# Patient Record
Sex: Female | Born: 1985
Health system: Southern US, Community
[De-identification: ages and names within clinical notes are randomized; demographics above are authoritative.]

---
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
## 2014-05-20 LAB — CBC AND DIFFERENTIAL
HEMATOCRIT: 41 % (ref 36–46)
Hemoglobin: 15 g/dL (ref 12.0–16.0)
NEUTROS ABS: 55 /uL
Platelets: 248 10*3/uL (ref 150–399)
WBC: 4.6 10^3/mL

## 2014-05-20 LAB — TSH: TSH: 2.76 u[IU]/mL (ref <=5.90)

## 2014-05-20 LAB — HEPATIC FUNCTION PANEL
ALK PHOS: 75 U/L (ref 25–125)
ALT: 14 U/L (ref 7–35)
AST: 23 U/L (ref 13–35)
BILIRUBIN, TOTAL: 0.7 mg/dL

## 2014-05-20 LAB — BASIC METABOLIC PANEL
BUN: 12 mg/dL (ref 4–21)
CREATININE: 0.8 mg/dL (ref <=1.1)
Glucose: 92 mg/dL
POTASSIUM: 4.4 mmol/L (ref 3.4–5.3)
SODIUM: 139 mmol/L (ref 137–147)

## 2014-05-20 LAB — LIPID PANEL
CHOLESTEROL: 193 mg/dL (ref 0–200)
HDL: 86 mg/dL — AB (ref 35–70)
LDL CALC: 83 mg/dL
LDl/HDL Ratio: 1
Triglycerides: 119 mg/dL (ref 40–160)

## 2014-10-05 DIAGNOSIS — G4700 Insomnia, unspecified: Secondary | ICD-10-CM | POA: Insufficient documentation

## 2014-10-05 DIAGNOSIS — F988 Other specified behavioral and emotional disorders with onset usually occurring in childhood and adolescence: Secondary | ICD-10-CM | POA: Insufficient documentation

## 2014-10-05 DIAGNOSIS — F419 Anxiety disorder, unspecified: Secondary | ICD-10-CM | POA: Insufficient documentation

## 2014-10-05 DIAGNOSIS — G2581 Restless legs syndrome: Secondary | ICD-10-CM | POA: Insufficient documentation

## 2014-10-05 DIAGNOSIS — L709 Acne, unspecified: Secondary | ICD-10-CM | POA: Insufficient documentation

## 2014-11-17 ENCOUNTER — Other Ambulatory Visit: Payer: Self-pay | Admitting: Family Medicine

## 2014-11-17 NOTE — Telephone Encounter (Signed)
Pt contacted office for refill request on the following medications: Adderall ER 30 mg. Thanks TNP

## 2014-11-18 MED ORDER — AMPHETAMINE-DEXTROAMPHET ER 30 MG PO CP24: 30.0000 mg | ORAL_CAPSULE | Freq: Once | ORAL | Status: DC

## 2014-11-18 NOTE — Telephone Encounter (Signed)
Please review, order pulled down -aa

## 2014-12-02 ENCOUNTER — Encounter: Payer: Self-pay | Admitting: Family Medicine

## 2014-12-02 ENCOUNTER — Ambulatory Visit (INDEPENDENT_AMBULATORY_CARE_PROVIDER_SITE_OTHER): Payer: BLUE CROSS/BLUE SHIELD | Admitting: Family Medicine

## 2014-12-02 VITALS — BP 112/76 | HR 72 | Temp 98.6°F | Resp 12 | Ht 65.5 in | Wt 174.0 lb

## 2014-12-02 DIAGNOSIS — F909 Attention-deficit hyperactivity disorder, unspecified type: Secondary | ICD-10-CM | POA: Diagnosis not present

## 2014-12-02 DIAGNOSIS — G4700 Insomnia, unspecified: Secondary | ICD-10-CM

## 2014-12-02 DIAGNOSIS — F988 Other specified behavioral and emotional disorders with onset usually occurring in childhood and adolescence: Secondary | ICD-10-CM

## 2014-12-02 MED ORDER — AMPHETAMINE-DEXTROAMPHET ER 30 MG PO CP24: 30.0000 mg | ORAL_CAPSULE | Freq: Once | ORAL | Status: DC

## 2014-12-02 NOTE — Progress Notes (Signed)
Patient ID: Michelle Morales, female   DOB: 04/13/1986, 29 y.o.   MRN: [id number]    Subjective:  HPI  ADD Patient is here for follow up-LOV in February 2016. She takes Adderall XR 30 mg MOnday through Friday when she works and when she has to work on the weekends. Symptoms are controlled. Not having side effects.  Insomnia Here for follow up. Not sleeping well again for about 1 week now. Takes Lorazepam at bedtime but has not helped, she does have issues in her life that she thinks may be contributing to this.   Prior to Admission medications   Medication Sig Start Date End Date Taking? Authorizing Provider  amphetamine-dextroamphetamine (ADDERALL XR) 30 MG 24 hr capsule Take 1 capsule (30 mg total) by mouth once. 11/18/14   Iasha Mccalister Maceo Pro., MD  LORazepam (ATIVAN) 0.5 MG tablet Take by mouth. 06/25/14   Historical Provider, MD  MULTIPLE VITAMIN PO Take by mouth. 11/02/11   Historical Provider, MD    Patient Active Problem List   Diagnosis Date Noted  . Acne 10/05/2014  . ADD (attention deficit disorder) 10/05/2014  . Anxiety 10/05/2014  . Cannot sleep 10/05/2014  . Restless leg 10/05/2014    No past medical history on file.  History   Social History  . Marital Status: Single    Spouse Name: none  . Number of Children: 0  . Years of Education: 17   Occupational History  . [employer]    Social History Main Topics  . Smoking status: Former Smoker -- 0.25 packs/day    Types: Cigarettes    Quit date: 05/29/2010  . Smokeless tobacco: Never Used  . Alcohol Use: Yes     Comment: about 3 drinks a week  . Drug Use: No  . Sexual Activity: Not on file     Comment: ocassional smoker   Other Topics Concern  . Not on file   Social History Narrative    Allergies  Allergen Reactions  . Prednisone     Review of Systems  Constitutional: Negative for fever, chills, malaise/fatigue and diaphoresis.  Respiratory: Negative.   Cardiovascular: Negative.     Gastrointestinal: Negative.   Musculoskeletal: Negative.   Psychiatric/Behavioral: Depression: friends say she is depressed but she is not sure. The patient has insomnia.     Immunization History  Administered Date(s) Administered  . DTaP 02/18/1988, 04/19/1988, 06/19/1988, 06/15/1989, 12/26/1991  . Hepatitis A 10/05/2005, 07/20/2006  . Hepatitis B 03/14/1999, 04/25/1999, 09/26/1999  . HiB (PRP-OMP) 03/21/1989, 01/13/1993  . IPV 02/17/1988, 04/19/1988, 06/19/1988, 06/15/1989  . MMR 03/21/1989, 01/13/1993  . Meningococcal Conjugate 10/05/2005  . Td 10/23/2002  . Tdap 12/10/2008   Objective:  BP 112/76 mmHg  Pulse 72  Temp(Src) 98.6 F (37 C)  Resp 12  Ht 5' 5.5" (1.664 m)  Wt 174 lb (78.926 kg)  BMI 28.50 kg/m2  LMP 11/24/2014  Physical Exam  Lab Results  Component Value Date   WBC 4.6 05/20/2014   HGB 15.0 05/20/2014   HCT 41 05/20/2014   PLT 248 05/20/2014   CHOL 193 05/20/2014   TRIG 119 05/20/2014   HDL 86* 05/20/2014   LDLCALC 83 05/20/2014   TSH 2.76 05/20/2014    CMP     Component Value Date/Time   NA 139 05/20/2014   K 4.4 05/20/2014   BUN 12 05/20/2014   CREATININE 0.8 05/20/2014   AST 23 05/20/2014   ALT 14 05/20/2014   ALKPHOS 75 05/20/2014  Assessment and Plan :   1. ADD (attention deficit disorder) Stable. Refills provided x 3.  2. Insomnia Worsening.  3. Adjustment reaction Patient says her mother is an alcoholic and she is responsible for taking care of her day-to-day. She is going to try to do another city as she can do her job for many location. She feels confident this will help her. She is tearful and crying today but no actual depression rather diarrhea. Does feel trapped. Turn to clinic 2-3 months. I have done the exam and reviewed the above chart and it is accurate to the best of my knowledge.      Miguel Aschoff MD [facility] 12/02/2014 4:29 PM

## 2015-03-04 ENCOUNTER — Ambulatory Visit (INDEPENDENT_AMBULATORY_CARE_PROVIDER_SITE_OTHER): Payer: BLUE CROSS/BLUE SHIELD | Admitting: Family Medicine

## 2015-03-04 VITALS — BP 118/68 | HR 88 | Temp 98.0°F | Resp 16 | Wt 172.0 lb

## 2015-03-04 DIAGNOSIS — G4700 Insomnia, unspecified: Secondary | ICD-10-CM | POA: Diagnosis not present

## 2015-03-04 DIAGNOSIS — F419 Anxiety disorder, unspecified: Secondary | ICD-10-CM

## 2015-03-04 DIAGNOSIS — F4329 Adjustment disorder with other symptoms: Secondary | ICD-10-CM | POA: Diagnosis not present

## 2015-03-04 DIAGNOSIS — F909 Attention-deficit hyperactivity disorder, unspecified type: Secondary | ICD-10-CM | POA: Diagnosis not present

## 2015-03-04 DIAGNOSIS — F988 Other specified behavioral and emotional disorders with onset usually occurring in childhood and adolescence: Secondary | ICD-10-CM

## 2015-03-04 MED ORDER — AMPHETAMINE-DEXTROAMPHET ER 30 MG PO CP24: 30.0000 mg | ORAL_CAPSULE | Freq: Once | ORAL | Status: DC

## 2015-03-04 MED ORDER — LORAZEPAM 0.5 MG PO TABS: 0.5000 mg | ORAL_TABLET | Freq: Every day | ORAL | Status: DC

## 2015-03-04 NOTE — Progress Notes (Signed)
Patient ID: Michelle Morales, female   DOB: February 05, 1986, 29 y.o.   MRN: [id number]    Subjective:  HPI Pt is here for a 3 month follow up on ADD, insomnia and adjustment disorder. She reports that the Adderall is working well with no side effects. She is taking the Lorazepam about 3 nights a week. She reports that it is better some days, and worse some days. Adjustment disorder she states " it has its days..." She would like to be referred to a therapist.   Prior to Admission medications   Medication Sig Start Date End Date Taking? Authorizing Provider  amphetamine-dextroamphetamine (ADDERALL XR) 30 MG 24 hr capsule Take 1 capsule (30 mg total) by mouth once. 12/02/14  Yes Richard Maceo Pro., MD  LORazepam (ATIVAN) 0.5 MG tablet Take by mouth. 06/25/14  Yes Historical Provider, MD  MULTIPLE VITAMIN PO Take by mouth. 11/02/11  Yes Historical Provider, MD  amphetamine-dextroamphetamine (ADDERALL XR) 30 MG 24 hr capsule Take 1 capsule (30 mg total) by mouth once. 12/02/14   Richard Maceo Pro., MD  amphetamine-dextroamphetamine (ADDERALL XR) 30 MG 24 hr capsule Take 1 capsule (30 mg total) by mouth once. 12/02/14   Jerrol Banana., MD    Patient Active Problem List   Diagnosis Date Noted  . Acne 10/05/2014  . ADD (attention deficit disorder) 10/05/2014  . Anxiety 10/05/2014  . Cannot sleep 10/05/2014  . Restless leg 10/05/2014    No past medical history on file.  Social History   Social History  . Marital Status: Single    Spouse Name: none  . Number of Children: 0  . Years of Education: 17   Occupational History  . [employer]    Social History Main Topics  . Smoking status: Former Smoker -- 0.25 packs/day    Types: Cigarettes    Quit date: 05/29/2010  . Smokeless tobacco: Never Used  . Alcohol Use: Yes     Comment: about 3 drinks a week  . Drug Use: No  . Sexual Activity: Not on file     Comment: ocassional smoker   Other Topics Concern  . Not on file   Social  History Narrative    Allergies  Allergen Reactions  . Prednisone     Review of Systems  Constitutional: Negative.   HENT: Negative.   Eyes: Negative.   Respiratory: Negative.   Cardiovascular: Negative.   Gastrointestinal: Negative.   Genitourinary: Negative.   Musculoskeletal: Negative.   Skin: Negative.   Neurological: Negative.   Endo/Heme/Allergies: Negative.   Psychiatric/Behavioral: The patient is nervous/anxious and has insomnia.     Immunization History  Administered Date(s) Administered  . DTaP 02/18/1988, 04/19/1988, 06/19/1988, 06/15/1989, 12/26/1991  . Hepatitis A 10/05/2005, 07/20/2006  . Hepatitis B 03/14/1999, 04/25/1999, 09/26/1999  . HiB (PRP-OMP) 03/21/1989, 01/13/1993  . IPV 02/17/1988, 04/19/1988, 06/19/1988, 06/15/1989  . MMR 03/21/1989, 01/13/1993  . Meningococcal Conjugate 10/05/2005  . Td 10/23/2002  . Tdap 12/10/2008   Objective:  BP 118/68 mmHg  Pulse 88  Temp(Src) 98 F (36.7 C) (Oral)  Resp 16  Wt 172 lb (78.019 kg)  Physical Exam  Constitutional: She is oriented to person, place, and time and well-developed, well-nourished, and in no distress.  HENT:  Head: Normocephalic and atraumatic.  Right Ear: External ear normal.  Left Ear: External ear normal.  Nose: Nose normal.  Eyes: Conjunctivae are normal.  Neck: Neck supple.  Cardiovascular: Normal rate, regular rhythm and normal heart sounds.  Pulmonary/Chest: Effort normal and breath sounds normal.  Abdominal: Soft.  Neurological: She is alert and oriented to person, place, and time. Gait normal.  Skin: Skin is warm and dry.  Psychiatric: Mood, memory, affect and judgment normal.    Lab Results  Component Value Date   WBC 4.6 05/20/2014   HGB 15.0 05/20/2014   HCT 41 05/20/2014   PLT 248 05/20/2014   CHOL 193 05/20/2014   TRIG 119 05/20/2014   HDL 86* 05/20/2014   LDLCALC 83 05/20/2014   TSH 2.76 05/20/2014    CMP     Component Value Date/Time   NA 139 05/20/2014     K 4.4 05/20/2014   BUN 12 05/20/2014   CREATININE 0.8 05/20/2014   AST 23 05/20/2014   ALT 14 05/20/2014   ALKPHOS 75 05/20/2014    Assessment and Plan :  1. ADD (attention deficit disorder)  - amphetamine-dextroamphetamine (ADDERALL XR) 30 MG 24 hr capsule; Take 1 capsule (30 mg total) by mouth once.  Dispense: 30 capsule; Refill: 0 - amphetamine-dextroamphetamine (ADDERALL XR) 30 MG 24 hr capsule; Take 1 capsule (30 mg total) by mouth once.  Dispense: 30 capsule; Refill: 0  2. Anxiety  - LORazepam (ATIVAN) 0.5 MG tablet; Take 1 tablet (0.5 mg total) by mouth at bedtime.  Dispense: 30 tablet; Refill: 5 - Ambulatory referral to Psychology  3. Cannot sleep  - LORazepam (ATIVAN) 0.5 MG tablet; Take 1 tablet (0.5 mg total) by mouth at bedtime.  Dispense: 30 tablet; Refill: 5  4. Adjustment disorder with other symptom Pt trying to find another job in a different city. - Ambulatory referral to Psychology  I have done the exam and reviewed the above chart and it is accurate to the best of my knowledge.  Miguel Aschoff MD [facility] 03/04/2015 8:20 AM

## 2015-03-05 ENCOUNTER — Encounter: Payer: Self-pay | Admitting: Family Medicine

## 2015-06-09 ENCOUNTER — Encounter: Payer: Self-pay | Admitting: Family Medicine

## 2015-06-09 ENCOUNTER — Ambulatory Visit (INDEPENDENT_AMBULATORY_CARE_PROVIDER_SITE_OTHER): Payer: BLUE CROSS/BLUE SHIELD | Admitting: Family Medicine

## 2015-06-09 VITALS — BP 122/84 | HR 104 | Temp 99.0°F | Resp 16 | Wt 166.0 lb

## 2015-06-09 DIAGNOSIS — F988 Other specified behavioral and emotional disorders with onset usually occurring in childhood and adolescence: Secondary | ICD-10-CM

## 2015-06-09 DIAGNOSIS — F909 Attention-deficit hyperactivity disorder, unspecified type: Secondary | ICD-10-CM | POA: Diagnosis not present

## 2015-06-09 DIAGNOSIS — F419 Anxiety disorder, unspecified: Secondary | ICD-10-CM

## 2015-06-09 MED ORDER — AMPHETAMINE-DEXTROAMPHET ER 30 MG PO CP24: 30.0000 mg | ORAL_CAPSULE | Freq: Once | ORAL | Status: DC

## 2015-06-09 NOTE — Progress Notes (Signed)
Patient ID: Michelle Morales, female   DOB: 23-Nov-1985, 30 y.o.   MRN: [id number]    Subjective:  HPI Pt is here for a 3 month follow up of ADD. She reports that she is doing well on her Adderall, side known side effects. She is seeing a therapist for her anxiety and seems to be helping, she has only seen her a few times.   Prior to Admission medications   Medication Sig Start Date End Date Taking? Authorizing Provider  amphetamine-dextroamphetamine (ADDERALL XR) 30 MG 24 hr capsule Take 1 capsule (30 mg total) by mouth once. 06/09/15  Yes Carlin Attridge Maceo Pro., MD  LORazepam (ATIVAN) 0.5 MG tablet Take 1 tablet (0.5 mg total) by mouth at bedtime. 03/04/15  Yes Diannah Rindfleisch Maceo Pro., MD  MULTIPLE VITAMIN PO Take by mouth. 11/02/11  Yes Historical Provider, MD    Patient Active Problem List   Diagnosis Date Noted  . Acne 10/05/2014  . ADD (attention deficit disorder) 10/05/2014  . Anxiety 10/05/2014  . Cannot sleep 10/05/2014  . Restless leg 10/05/2014    History reviewed. No pertinent past medical history.  Social History   Social History  . Marital Status: Single    Spouse Name: none  . Number of Children: 0  . Years of Education: 17   Occupational History  . [employer]    Social History Main Topics  . Smoking status: Former Smoker -- 0.25 packs/day    Types: Cigarettes    Quit date: 05/29/2010  . Smokeless tobacco: Never Used  . Alcohol Use: Yes     Comment: about 3 drinks a week  . Drug Use: No  . Sexual Activity: Not on file     Comment: ocassional smoker   Other Topics Concern  . Not on file   Social History Narrative    Allergies  Allergen Reactions  . Prednisone     Review of Systems  Constitutional: Negative.   HENT: Negative.   Eyes: Negative.   Respiratory: Negative.   Cardiovascular: Negative.   Gastrointestinal: Negative.   Genitourinary: Negative.   Musculoskeletal: Negative.   Skin: Negative.   Neurological: Negative.     Endo/Heme/Allergies: Negative.   Psychiatric/Behavioral: Negative.     Immunization History  Administered Date(s) Administered  . DTaP 02/18/1988, 04/19/1988, 06/19/1988, 06/15/1989, 12/26/1991  . Hepatitis A 10/05/2005, 07/20/2006  . Hepatitis B 03/14/1999, 04/25/1999, 09/26/1999  . HiB (PRP-OMP) 03/21/1989, 01/13/1993  . IPV 02/17/1988, 04/19/1988, 06/19/1988, 06/15/1989  . MMR 03/21/1989, 01/13/1993  . Meningococcal Conjugate 10/05/2005  . Td 10/23/2002  . Tdap 12/10/2008   Objective:  BP 122/84 mmHg  Pulse 104  Temp(Src) 99 F (37.2 C) (Oral)  Resp 16  Wt 166 lb (75.297 kg)  Physical Exam  Constitutional: She is oriented to person, place, and time and well-developed, well-nourished, and in no distress.  HENT:  Head: Normocephalic and atraumatic.  Right Ear: External ear normal.  Left Ear: External ear normal.  Nose: Nose normal.  Eyes: Conjunctivae and EOM are normal. Pupils are equal, round, and reactive to light.  Neck: Normal range of motion. Neck supple.  Cardiovascular: Normal rate, regular rhythm, normal heart sounds and intact distal pulses.   Pulmonary/Chest: Effort normal and breath sounds normal.  Abdominal: Soft. Bowel sounds are normal.  Musculoskeletal: Normal range of motion.  Neurological: She is alert and oriented to person, place, and time. She has normal reflexes. Gait normal. GCS score is 15.  Skin: Skin is warm and dry.  Psychiatric: Mood, memory, affect and judgment normal.    Lab Results  Component Value Date   WBC 4.6 05/20/2014   HGB 15.0 05/20/2014   HCT 41 05/20/2014   PLT 248 05/20/2014   CHOL 193 05/20/2014   TRIG 119 05/20/2014   HDL 86* 05/20/2014   LDLCALC 83 05/20/2014   TSH 2.76 05/20/2014    CMP     Component Value Date/Time   NA 139 05/20/2014   K 4.4 05/20/2014   BUN 12 05/20/2014   CREATININE 0.8 05/20/2014   AST 23 05/20/2014   ALT 14 05/20/2014   ALKPHOS 75 05/20/2014    Assessment and Plan :  1. ADD  (attention deficit disorder) Refills provided times 3. - amphetamine-dextroamphetamine (ADDERALL XR) 30 MG 24 hr capsule; Take 1 capsule (30 mg total) by mouth once.  Dispense: 30 capsule; Refill: 0  2. Anxiety Improving with therapist. Continue to follow.  I have done the exam and reviewed the above chart and it is accurate to the best of my knowledge.  Patient was seen and examined by Dr. Miguel Aschoff, and noted scribed by Webb Laws, [place] MD [facility] 06/09/2015 8:24 AM

## 2015-09-21 ENCOUNTER — Other Ambulatory Visit: Payer: Self-pay | Admitting: Family Medicine

## 2015-09-21 DIAGNOSIS — F988 Other specified behavioral and emotional disorders with onset usually occurring in childhood and adolescence: Secondary | ICD-10-CM

## 2015-09-21 NOTE — Telephone Encounter (Signed)
Please review-aa 

## 2015-09-21 NOTE — Telephone Encounter (Signed)
Okay for 1 month 

## 2015-09-21 NOTE — Telephone Encounter (Signed)
Pt contacted office for refill request on the following medications: amphetamine-dextroamphetamine (ADDERALL XR) 30 MG 24 hr capsule. Please advise. Thanks TNP

## 2015-09-22 MED ORDER — AMPHETAMINE-DEXTROAMPHET ER 30 MG PO CP24: 30.0000 mg | ORAL_CAPSULE | Freq: Once | ORAL | Status: DC

## 2015-09-22 NOTE — Telephone Encounter (Signed)
RX printed and pt advised-aa 

## 2015-10-07 ENCOUNTER — Encounter: Payer: Self-pay | Admitting: Family Medicine

## 2015-10-07 ENCOUNTER — Ambulatory Visit (INDEPENDENT_AMBULATORY_CARE_PROVIDER_SITE_OTHER): Payer: BLUE CROSS/BLUE SHIELD | Admitting: Family Medicine

## 2015-10-07 VITALS — BP 128/76 | HR 86 | Resp 14 | Wt 172.0 lb

## 2015-10-07 DIAGNOSIS — F988 Other specified behavioral and emotional disorders with onset usually occurring in childhood and adolescence: Secondary | ICD-10-CM

## 2015-10-07 DIAGNOSIS — G4700 Insomnia, unspecified: Secondary | ICD-10-CM

## 2015-10-07 DIAGNOSIS — F909 Attention-deficit hyperactivity disorder, unspecified type: Secondary | ICD-10-CM | POA: Diagnosis not present

## 2015-10-07 DIAGNOSIS — F419 Anxiety disorder, unspecified: Secondary | ICD-10-CM

## 2015-10-07 MED ORDER — AMPHETAMINE-DEXTROAMPHET ER 30 MG PO CP24: 30.0000 mg | ORAL_CAPSULE | Freq: Once | ORAL | Status: DC

## 2015-10-07 NOTE — Progress Notes (Signed)
Patient ID: Michelle Morales, female   DOB: 1986-05-25, 30 y.o.   MRN: [id number]    Subjective:  HPI  Patient is here for follow up:  ADD: Patient takes Adderall XR 30 mg 1 tablet daily about 5 days a week for work. Symptoms are stable. She is seen therapist and has started taking Sam-E takes 200 mg 2 tablets daily for the past 2 weeks.  Insomnia: Patient takes Lorazepam about 3 to 5 days a week to help her sleep.   Prior to Admission medications   Medication Sig Start Date End Date Taking? Authorizing Provider  amphetamine-dextroamphetamine (ADDERALL XR) 30 MG 24 hr capsule Take 1 capsule (30 mg total) by mouth once. 09/22/15  Yes Richard Maceo Pro., MD  LORazepam (ATIVAN) 0.5 MG tablet Take 1 tablet (0.5 mg total) by mouth at bedtime. 03/04/15  Yes Richard Maceo Pro., MD  MULTIPLE VITAMIN PO Take by mouth. 11/02/11  Yes Historical Provider, MD  S-Adenosylmethionine (SAM-E) 200 MG TABS Take 2 tablets by mouth daily.   Yes Historical Provider, MD    Patient Active Problem List   Diagnosis Date Noted  . Acne 10/05/2014  . ADD (attention deficit disorder) 10/05/2014  . Anxiety 10/05/2014  . Cannot sleep 10/05/2014  . Restless leg 10/05/2014    No past medical history on file.  Social History   Social History  . Marital Status: Single    Spouse Name: none  . Number of Children: 0  . Years of Education: 17   Occupational History  . [employer]    Social History Main Topics  . Smoking status: Former Smoker -- 0.25 packs/day    Types: Cigarettes    Quit date: 05/29/2010  . Smokeless tobacco: Never Used  . Alcohol Use: Yes     Comment: about 3 drinks a week  . Drug Use: No  . Sexual Activity: Not on file     Comment: ocassional smoker   Other Topics Concern  . Not on file   Social History Narrative    Allergies  Allergen Reactions  . Prednisone     Review of Systems  Constitutional: Negative.   HENT: Negative.   Eyes: Negative.   Respiratory:  Negative.   Cardiovascular: Negative.   Musculoskeletal: Negative.   Skin: Negative.   Neurological: Negative.   Psychiatric/Behavioral: Positive for depression. The patient is nervous/anxious and has insomnia.        Symptoms controlled on the medication. Not suicidal or homicidal.    Immunization History  Administered Date(s) Administered  . DTaP 02/18/1988, 04/19/1988, 06/19/1988, 06/15/1989, 12/26/1991  . Hepatitis A 10/05/2005, 07/20/2006  . Hepatitis B 03/14/1999, 04/25/1999, 09/26/1999  . HiB (PRP-OMP) 03/21/1989, 01/13/1993  . IPV 02/17/1988, 04/19/1988, 06/19/1988, 06/15/1989  . MMR 03/21/1989, 01/13/1993  . Meningococcal Conjugate 10/05/2005  . Td 10/23/2002  . Tdap 12/10/2008   Objective:  BP 128/76 mmHg  Pulse 86  Resp 14  Wt 172 lb (78.019 kg)  LMP 09/23/2015  Physical Exam  Constitutional: She is oriented to person, place, and time and well-developed, well-nourished, and in no distress.  HENT:  Head: Normocephalic and atraumatic.  Right Ear: External ear normal.  Left Ear: External ear normal.  Nose: Nose normal.  Eyes: Conjunctivae are normal. Pupils are equal, round, and reactive to light.  Neck: Normal range of motion. Neck supple.  Cardiovascular: Normal rate, regular rhythm, normal heart sounds and intact distal pulses.   No murmur heard. Pulmonary/Chest: Effort normal and breath sounds  normal. No respiratory distress. She has no wheezes.  Abdominal: Soft.  Musculoskeletal: She exhibits no edema or tenderness.  Neurological: She is alert and oriented to person, place, and time.  Skin: Skin is warm and dry.  Psychiatric: Mood, memory, affect and judgment normal.    Lab Results  Component Value Date   WBC 4.6 05/20/2014   HGB 15.0 05/20/2014   HCT 41 05/20/2014   PLT 248 05/20/2014   CHOL 193 05/20/2014   TRIG 119 05/20/2014   HDL 86* 05/20/2014   LDLCALC 83 05/20/2014   TSH 2.76 05/20/2014    CMP     Component Value Date/Time   NA 139  05/20/2014   K 4.4 05/20/2014   BUN 12 05/20/2014   CREATININE 0.8 05/20/2014   AST 23 05/20/2014   ALT 14 05/20/2014   ALKPHOS 75 05/20/2014    Assessment and Plan :  1. ADD (attention deficit disorder) Stable. Refill given. RTC 4 months. - amphetamine-dextroamphetamine (ADDERALL XR) 30 MG 24 hr capsule; Take 1 capsule (30 mg total) by mouth once.  Dispense: 30 capsule; Refill: 0 - amphetamine-dextroamphetamine (ADDERALL XR) 30 MG 24 hr capsule; Take 1 capsule (30 mg total) by mouth once.  Dispense: 30 capsule; Refill: 0  2. Anxiety/Depression Patient seems to be better today from her last visit with family issues. Encouraged to continue seen therapist. Advised patient that we may need to add a medication to help her emotionally and that it is ok to do that. Will follow. Advised patient she can follow up with [place] sooner than 4 months if she needs adjustment with her medications.May need SSRI.  3. Cannot sleep/Insomnia Stable.  Patient was seen and examined by Dr. Eulas Post and note was scribed by Theressa Millard, RMA.    Miguel Aschoff MD [facility] 10/07/2015 8:07 AM

## 2015-11-01 ENCOUNTER — Ambulatory Visit (INDEPENDENT_AMBULATORY_CARE_PROVIDER_SITE_OTHER): Payer: BLUE CROSS/BLUE SHIELD | Admitting: Family Medicine

## 2015-11-01 VITALS — BP 104/82 | HR 80 | Temp 98.4°F | Resp 16 | Wt 172.0 lb

## 2015-11-01 DIAGNOSIS — G4700 Insomnia, unspecified: Secondary | ICD-10-CM | POA: Diagnosis not present

## 2015-11-01 DIAGNOSIS — F419 Anxiety disorder, unspecified: Secondary | ICD-10-CM

## 2015-11-01 DIAGNOSIS — F909 Attention-deficit hyperactivity disorder, unspecified type: Secondary | ICD-10-CM | POA: Diagnosis not present

## 2015-11-01 DIAGNOSIS — F988 Other specified behavioral and emotional disorders with onset usually occurring in childhood and adolescence: Secondary | ICD-10-CM

## 2015-11-01 MED ORDER — LORAZEPAM 1 MG PO TABS: 1.0000 mg | ORAL_TABLET | Freq: Every day | ORAL | Status: DC

## 2015-11-01 MED ORDER — SERTRALINE HCL 50 MG PO TABS: 50.0000 mg | ORAL_TABLET | Freq: Every day | ORAL | Status: DC

## 2015-11-01 NOTE — Progress Notes (Signed)
Patient ID: Michelle Morales, female   DOB: [birth year], 30 y.o.   MRN: [id number]    Subjective:  HPI  Patient is here to follow up on anxiety. Patient states that things with family and the issues she has had with that just up and down and just does not handle things good at this time. She does take Lorazepam as needed-last RX was given in October 2016, she takes it bedtime and it helps her calm down and be able to go to sleep.   Depression screen [facility] 2/9 11/01/2015 12/02/2014  Decreased Interest 3 0  Down, Depressed, Hopeless 2 2  PHQ - 2 Score 5 2  Altered sleeping 3 3  Tired, decreased energy 1 2  Change in appetite 0 0  Feeling bad or failure about yourself  0 0  Trouble concentrating 0 0  Moving slowly or fidgety/restless 0 0  Suicidal thoughts 0 0  PHQ-9 Score 9 7  Difficult doing work/chores Somewhat difficult Not difficult at all     Prior to Admission medications   Medication Sig Start Date End Date Taking? Authorizing Provider  amphetamine-dextroamphetamine (ADDERALL XR) 30 MG 24 hr capsule Take 1 capsule (30 mg total) by mouth once. 10/07/15  Yes Marvelous Bouwens Maceo Pro., MD  amphetamine-dextroamphetamine (ADDERALL XR) 30 MG 24 hr capsule Take 1 capsule (30 mg total) by mouth once. 10/07/15  Yes Jimya Ciani Maceo Pro., MD  LORazepam (ATIVAN) 0.5 MG tablet Take 1 tablet (0.5 mg total) by mouth at bedtime. 03/04/15  Yes Deserie Dirks Maceo Pro., MD  MULTIPLE VITAMIN PO Take by mouth. 11/02/11  Yes Historical Provider, MD  S-Adenosylmethionine (SAM-E) 200 MG TABS Take 2 tablets by mouth daily.   Yes Historical Provider, MD    Patient Active Problem List   Diagnosis Date Noted  . Acne 10/05/2014  . ADD (attention deficit disorder) 10/05/2014  . Anxiety 10/05/2014  . Cannot sleep 10/05/2014  . Restless leg 10/05/2014    No past medical history on file.  Social History   Social History  . Marital Status: Single    Spouse Name: none  . Number of Children: 0  . Years of  Education: 17   Occupational History  . [employer]    Social History Main Topics  . Smoking status: Former Smoker -- 0.25 packs/day    Types: Cigarettes    Quit date: 05/29/2010  . Smokeless tobacco: Never Used  . Alcohol Use: Yes     Comment: about 3 drinks a week  . Drug Use: No  . Sexual Activity: Not on file     Comment: ocassional smoker   Other Topics Concern  . Not on file   Social History Narrative    Allergies  Allergen Reactions  . Prednisone     Review of Systems  Constitutional: Negative.   Eyes: Negative.   Respiratory: Negative.   Cardiovascular: Negative.   Gastrointestinal: Negative.   Skin: Negative.   Neurological: Negative.   Endo/Heme/Allergies: Negative.   Psychiatric/Behavioral: Positive for depression. The patient is nervous/anxious and has insomnia.     Immunization History  Administered Date(s) Administered  . DTaP 02/18/1988, 04/19/1988, 06/19/1988, 06/15/1989, 12/26/1991  . Hepatitis A 10/05/2005, 07/20/2006  . Hepatitis B 03/14/1999, 04/25/1999, 09/26/1999  . HiB (PRP-OMP) 03/21/1989, 01/13/1993  . IPV 02/17/1988, 04/19/1988, 06/19/1988, 06/15/1989  . MMR 03/21/1989, 01/13/1993  . Meningococcal Conjugate 10/05/2005  . Td 10/23/2002  . Tdap 12/10/2008   Objective:  BP 104/82 mmHg  Pulse 80  Temp(Src) 98.4 F (36.9 C)  Resp 16  Wt 172 lb (78.019 kg)  LMP 09/23/2015  Physical Exam  Constitutional: She is oriented to person, place, and time and well-developed, well-nourished, and in no distress.  HENT:  Head: Normocephalic and atraumatic.  Right Ear: External ear normal.  Left Ear: External ear normal.  Nose: Nose normal.  Eyes: Conjunctivae are normal. Pupils are equal, round, and reactive to light.  Neck: Normal range of motion. Neck supple.  Cardiovascular: Normal rate, regular rhythm, normal heart sounds and intact distal pulses.   No murmur heard. Pulmonary/Chest: Effort normal and breath sounds normal. No  respiratory distress. She has no wheezes.  Abdominal: Soft.  Musculoskeletal: She exhibits no edema or tenderness.  Neurological: She is alert and oriented to person, place, and time. Gait normal.  Skin: Skin is warm and dry.  Psychiatric: Mood, memory, affect and judgment normal.    Lab Results  Component Value Date   WBC 4.6 05/20/2014   HGB 15.0 05/20/2014   HCT 41 05/20/2014   PLT 248 05/20/2014   CHOL 193 05/20/2014   TRIG 119 05/20/2014   HDL 86* 05/20/2014   LDLCALC 83 05/20/2014   TSH 2.76 05/20/2014    CMP     Component Value Date/Time   NA 139 05/20/2014   K 4.4 05/20/2014   BUN 12 05/20/2014   CREATININE 0.8 05/20/2014   AST 23 05/20/2014   ALT 14 05/20/2014   ALKPHOS 75 05/20/2014    Assessment and Plan :  1. Anxiety Worsening. Add Sertraline. Increase Lorazepam to 1 mg at bedtime. - sertraline (ZOLOFT) 50 MG tablet; Take 1 tablet (50 mg total) by mouth daily.  Dispense: 30 tablet; Refill: 12 - LORazepam (ATIVAN) 1 MG tablet; Take 1 tablet (1 mg total) by mouth at bedtime.  Dispense: 30 tablet; Refill: 5 Patient denies any suicidal thoughts or ideation. No alcohol use or illegal drugs. 2. Cannot sleep Increase dose. - LORazepam (ATIVAN) 1 MG tablet; Take 1 tablet (1 mg total) by mouth at bedtime.  Dispense: 30 tablet; Refill: 5  3. ADD (attention deficit disorder) Stable.  Patient was seen and examined by Dr. Eulas Post and note was scribed by Theressa Millard, RMA.     Miguel Aschoff MD [facility] 11/01/2015 3:38 PM

## 2015-12-06 ENCOUNTER — Ambulatory Visit (INDEPENDENT_AMBULATORY_CARE_PROVIDER_SITE_OTHER): Payer: BLUE CROSS/BLUE SHIELD | Admitting: Family Medicine

## 2015-12-06 ENCOUNTER — Encounter: Payer: Self-pay | Admitting: Family Medicine

## 2015-12-06 VITALS — BP 118/78 | HR 88 | Temp 97.8°F | Resp 16 | Wt 170.0 lb

## 2015-12-06 DIAGNOSIS — F909 Attention-deficit hyperactivity disorder, unspecified type: Secondary | ICD-10-CM

## 2015-12-06 DIAGNOSIS — F4329 Adjustment disorder with other symptoms: Secondary | ICD-10-CM

## 2015-12-06 DIAGNOSIS — F419 Anxiety disorder, unspecified: Secondary | ICD-10-CM | POA: Diagnosis not present

## 2015-12-06 DIAGNOSIS — F988 Other specified behavioral and emotional disorders with onset usually occurring in childhood and adolescence: Secondary | ICD-10-CM

## 2015-12-06 MED ORDER — BUPROPION HCL ER (XL) 150 MG PO TB24: 150.0000 mg | ORAL_TABLET | Freq: Every day | ORAL | Status: DC

## 2015-12-06 NOTE — Progress Notes (Signed)
Patient ID: Michelle Morales, female   DOB: January 02, 1986, 30 y.o.   MRN: [id number]    Subjective:  HPI Depression/anxiety- Pt is here today for a 4 week follow up of depression. She was started on Sertraline 50 mg at last OV. She reports that she could not take the medication because it gave her an upset stomach and when she was working in the yard or got hot she would get nauseous and have a headache, so she stopped the medication. She would like to try something else.  She is not suicidal. Prior to Admission medications   Medication Sig Start Date End Date Taking? Authorizing Provider  amphetamine-dextroamphetamine (ADDERALL XR) 30 MG 24 hr capsule Take 1 capsule (30 mg total) by mouth once. 10/07/15  Yes Richard Maceo Pro., MD  amphetamine-dextroamphetamine (ADDERALL XR) 30 MG 24 hr capsule Take 1 capsule (30 mg total) by mouth once. 10/07/15  Yes Richard Maceo Pro., MD  LORazepam (ATIVAN) 1 MG tablet Take 1 tablet (1 mg total) by mouth at bedtime. 11/01/15  Yes Richard Maceo Pro., MD  MULTIPLE VITAMIN PO Take by mouth. 11/02/11  Yes Historical Provider, MD    Patient Active Problem List   Diagnosis Date Noted  . Acne 10/05/2014  . ADD (attention deficit disorder) 10/05/2014  . Anxiety 10/05/2014  . Cannot sleep 10/05/2014  . Restless leg 10/05/2014    History reviewed. No pertinent past medical history.  Social History   Social History  . Marital Status: Single    Spouse Name: none  . Number of Children: 0  . Years of Education: 17   Occupational History  . [employer]    Social History Main Topics  . Smoking status: Former Smoker -- 0.25 packs/day    Types: Cigarettes    Quit date: 05/29/2010  . Smokeless tobacco: Never Used  . Alcohol Use: Yes     Comment: about 3 drinks a week  . Drug Use: No  . Sexual Activity: Not on file     Comment: ocassional smoker   Other Topics Concern  . Not on file   Social History Narrative    Allergies  Allergen  Reactions  . Prednisone     Review of Systems  Constitutional: Negative.   HENT: Negative.   Eyes: Negative.   Respiratory: Negative.   Cardiovascular: Negative.   Gastrointestinal: Negative.   Genitourinary: Negative.   Musculoskeletal: Negative.   Skin: Negative.   Neurological: Negative.   Endo/Heme/Allergies: Negative.   Psychiatric/Behavioral: Positive for depression. The patient is nervous/anxious.     Immunization History  Administered Date(s) Administered  . DTaP 02/18/1988, 04/19/1988, 06/19/1988, 06/15/1989, 12/26/1991  . Hepatitis A 10/05/2005, 07/20/2006  . Hepatitis B 03/14/1999, 04/25/1999, 09/26/1999  . HiB (PRP-OMP) 03/21/1989, 01/13/1993  . IPV 02/17/1988, 04/19/1988, 06/19/1988, 06/15/1989  . MMR 03/21/1989, 01/13/1993  . Meningococcal Conjugate 10/05/2005  . Td 10/23/2002  . Tdap 12/10/2008   Objective:  BP 118/78 mmHg  Pulse 88  Temp(Src) 97.8 F (36.6 C) (Oral)  Resp 16  Wt 170 lb (77.111 kg)  Physical Exam  Constitutional: She is oriented to person, place, and time and well-developed, well-nourished, and in no distress.  Cardiovascular: Normal rate, regular rhythm, normal heart sounds and intact distal pulses.   Pulmonary/Chest: Effort normal and breath sounds normal.  Neurological: She is alert and oriented to person, place, and time. She has normal reflexes. Gait normal. GCS score is 15.  Skin: Skin is warm and dry.  Psychiatric: Mood, memory, affect and judgment normal.    Lab Results  Component Value Date   WBC 4.6 05/20/2014   HGB 15.0 05/20/2014   HCT 41 05/20/2014   PLT 248 05/20/2014   CHOL 193 05/20/2014   TRIG 119 05/20/2014   HDL 86* 05/20/2014   LDLCALC 83 05/20/2014   TSH 2.76 05/20/2014    CMP     Component Value Date/Time   NA 139 05/20/2014   K 4.4 05/20/2014   BUN 12 05/20/2014   CREATININE 0.8 05/20/2014   AST 23 05/20/2014   ALT 14 05/20/2014   ALKPHOS 75 05/20/2014    Assessment and Plan :  1.  Anxiety  2. Adjustment disorder with other symptom Could not tolerate Sertraline. Will try Wellbutrin XL. If can not tolerate Wellbutrin will try Venlafaxine. Pt to continue counseling and follow up 1 month. Next choice Venlafaxine.  - buPROPion (WELLBUTRIN XL) 150 MG 24 hr tablet; Take 1 tablet (150 mg total) by mouth daily.  Dispense: 30 tablet; Refill: 12  3. ADD (attention deficit disorder) RF next month.   Patient was seen and examined by Dr. Miguel Aschoff, and noted scribed by Webb Laws, [place] MD [facility] 12/06/2015 8:19 AM

## 2016-01-04 ENCOUNTER — Ambulatory Visit (INDEPENDENT_AMBULATORY_CARE_PROVIDER_SITE_OTHER): Payer: BLUE CROSS/BLUE SHIELD | Admitting: Family Medicine

## 2016-01-04 ENCOUNTER — Encounter: Payer: Self-pay | Admitting: Family Medicine

## 2016-01-04 VITALS — BP 110/72 | HR 80 | Temp 98.4°F | Resp 16 | Wt 171.0 lb

## 2016-01-04 DIAGNOSIS — F909 Attention-deficit hyperactivity disorder, unspecified type: Secondary | ICD-10-CM | POA: Diagnosis not present

## 2016-01-04 DIAGNOSIS — F32A Depression, unspecified: Secondary | ICD-10-CM

## 2016-01-04 DIAGNOSIS — F419 Anxiety disorder, unspecified: Secondary | ICD-10-CM | POA: Diagnosis not present

## 2016-01-04 DIAGNOSIS — F988 Other specified behavioral and emotional disorders with onset usually occurring in childhood and adolescence: Secondary | ICD-10-CM

## 2016-01-04 DIAGNOSIS — F329 Major depressive disorder, single episode, unspecified: Secondary | ICD-10-CM

## 2016-01-04 MED ORDER — AMPHETAMINE-DEXTROAMPHET ER 30 MG PO CP24: 30.0000 mg | ORAL_CAPSULE | Freq: Once | ORAL | 0 refills | Status: DC

## 2016-01-04 MED ORDER — PAROXETINE HCL 20 MG PO TABS: 20.0000 mg | ORAL_TABLET | Freq: Every day | ORAL | 5 refills | Status: AC

## 2016-01-04 NOTE — Progress Notes (Signed)
Patient: Michelle SparrowCatherine V Silas Female    DOB: [birth year]   30 y.o.   MRN: [id number] Visit Date: 01/04/2016  Today's Provider: Megan Mansichard Amario Longmore Jr, MD   Chief Complaint  Patient presents with  . Depression  . Anxiety   Subjective:    HPI Patient comes in today to follow up on her depression and anxiety. Patient was started on Wellbutrin XL because she could not tolerate Sertraline. Patient reports that she also could not tolerate Wellbutrin. She reports that it made her feel like a "zombie". Patient is wanting to try something different.     Allergies  Allergen Reactions  . Prednisone    Current Meds  Medication Sig  . amphetamine-dextroamphetamine (ADDERALL XR) 30 MG 24 hr capsule Take 1 capsule (30 mg total) by mouth once.  Marland Kitchen. amphetamine-dextroamphetamine (ADDERALL XR) 30 MG 24 hr capsule Take 1 capsule (30 mg total) by mouth once.  Marland Kitchen. LORazepam (ATIVAN) 1 MG tablet Take 1 tablet (1 mg total) by mouth at bedtime.  . MULTIPLE VITAMIN PO Take by mouth.    Review of Systems  Constitutional: Negative.   Respiratory: Negative.   Cardiovascular: Negative.   Gastrointestinal: Negative.   Musculoskeletal: Negative.   Neurological: Positive for headaches. Negative for dizziness, tremors, seizures, syncope, facial asymmetry, speech difficulty, weakness, light-headedness and numbness.  Psychiatric/Behavioral: Positive for decreased concentration. Negative for agitation, behavioral problems, confusion, dysphoric mood, hallucinations, self-injury, sleep disturbance and suicidal ideas. The patient is nervous/anxious. The patient is not hyperactive.     Social History  Substance Use Topics  . Smoking status: Former Smoker    Packs/day: 0.25    Types: Cigarettes    Quit date: 05/29/2010  . Smokeless tobacco: Never Used  . Alcohol use Yes     Comment: about 3 drinks a week   Objective:   BP 110/72 (BP Location: Right Arm, Patient Position: Sitting, Cuff Size: Normal)   Pulse 80    Temp 98.4 F (36.9 C)   Resp 16   Wt 171 lb (77.6 kg)   BMI 28.02 kg/m   Physical Exam  Constitutional: She is oriented to person, place, and time. She appears well-developed and well-nourished.  HENT:  Head: Normocephalic and atraumatic.  Right Ear: External ear normal.  Left Ear: External ear normal.  Nose: Nose normal.  Eyes: Conjunctivae are normal.  Neck: Neck supple. No thyromegaly present.  Cardiovascular: Normal rate, regular rhythm and normal heart sounds.   Pulmonary/Chest: Effort normal and breath sounds normal.  Abdominal: Soft.  Lymphadenopathy:    She has no cervical adenopathy.  Neurological: She is alert and oriented to person, place, and time. No cranial nerve deficit. Coordination normal.  Skin: Skin is warm and dry.  Psychiatric: She has a normal mood and affect. Her behavior is normal. Judgment and thought content normal.        Assessment & Plan:     1. ADD (attention deficit disorder) This also probably helps her mood a little bit with the depression. - amphetamine-dextroamphetamine (ADDERALL XR) 30 MG 24 hr capsule; Take 1 capsule (30 mg total) by mouth once.  Dispense: 30 capsule; Refill: 0 Meds rf times 3. 2. Anxiety Should be helped by paroxetine.  3. Depression She is not suicidal at all. She had nausea with the sertraline and became somnolent with Wellbutrin. Patient has a Veterinary surgeoncounselor and they discussed Paxil and I think this is an ideal choice. I warned  her about the appetite increase. I will  see her back in one month. I really do think the Paxil will help. If not better then we'll try and assess in [place] all worried about nausea with that. Would also refer to psychiatry at that time and this is discussed with patient today. Could also consider a low-dose mood stabilizer.      I have done the exam and reviewed the above chart and it is accurate to the best of my knowledge.   Tylor Courtwright Wendelyn Breslow, MD  [facility]

## 2016-02-07 ENCOUNTER — Ambulatory Visit: Payer: BLUE CROSS/BLUE SHIELD | Admitting: Family Medicine

## 2016-02-08 ENCOUNTER — Ambulatory Visit: Payer: BLUE CROSS/BLUE SHIELD | Admitting: Family Medicine

## 2016-02-14 ENCOUNTER — Ambulatory Visit: Payer: BLUE CROSS/BLUE SHIELD | Admitting: Family Medicine

## 2016-02-23 ENCOUNTER — Ambulatory Visit (INDEPENDENT_AMBULATORY_CARE_PROVIDER_SITE_OTHER): Payer: BLUE CROSS/BLUE SHIELD | Admitting: Family Medicine

## 2016-02-23 ENCOUNTER — Encounter: Payer: Self-pay | Admitting: Family Medicine

## 2016-02-23 VITALS — BP 110/72 | HR 84 | Temp 98.0°F | Resp 14 | Wt 172.0 lb

## 2016-02-23 DIAGNOSIS — F419 Anxiety disorder, unspecified: Secondary | ICD-10-CM | POA: Diagnosis not present

## 2016-02-23 DIAGNOSIS — F988 Other specified behavioral and emotional disorders with onset usually occurring in childhood and adolescence: Secondary | ICD-10-CM

## 2016-02-23 DIAGNOSIS — F329 Major depressive disorder, single episode, unspecified: Secondary | ICD-10-CM | POA: Diagnosis not present

## 2016-02-23 DIAGNOSIS — F32A Depression, unspecified: Secondary | ICD-10-CM

## 2016-02-23 MED ORDER — DICLOFENAC SODIUM 1 % TD GEL: 4.0000 g | Freq: Four times a day (QID) | TRANSDERMAL | 11 refills | Status: DC

## 2016-02-23 NOTE — Progress Notes (Signed)
Subjective:  HPI Depression/Anxiety- Pt is here for a 4 week follow up. She started Paxil and noted that she may need to see psy. PT reports that she thinks we may have found a "winner" for her. She reports that she is not having any side effects and she is feeling better. Pt reports that she is about 50%-75% improved. She also wants to ask if she can get a rx for topical NSAIDs her therapist recommended her ask so that she is not taking so much Ibuprofen when she has pain.   Prior to Admission medications   Medication Sig Start Date End Date Taking? Authorizing Provider  amphetamine-dextroamphetamine (ADDERALL XR) 30 MG 24 hr capsule Take 1 capsule (30 mg total) by mouth once. 10/07/15   Klay Sobotka Maceo Pro., MD  amphetamine-dextroamphetamine (ADDERALL XR) 30 MG 24 hr capsule Take 1 capsule (30 mg total) by mouth once. 01/04/16 01/04/16  Jerrol Banana., MD  buPROPion (WELLBUTRIN XL) 150 MG 24 hr tablet Take 1 tablet (150 mg total) by mouth daily. Patient not taking: Reported on 01/04/2016 12/06/15   Jerrol Banana., MD  LORazepam (ATIVAN) 1 MG tablet Take 1 tablet (1 mg total) by mouth at bedtime. 11/01/15   Deante Blough Maceo Pro., MD  MULTIPLE VITAMIN PO Take by mouth. 11/02/11   Historical Provider, MD  PARoxetine (PAXIL) 20 MG tablet Take 1 tablet (20 mg total) by mouth daily. 01/04/16   Jerrol Banana., MD    Patient Active Problem List   Diagnosis Date Noted  . Acne 10/05/2014  . ADD (attention deficit disorder) 10/05/2014  . Anxiety 10/05/2014  . Cannot sleep 10/05/2014  . Restless leg 10/05/2014    History reviewed. No pertinent past medical history.  Social History   Social History  . Marital status: Single    Spouse name: none  . Number of children: 0  . Years of education: 85   Occupational History  . [employer]    Social History Main Topics  . Smoking status: Former Smoker    Packs/day: 0.25    Types: Cigarettes    Quit date: 05/29/2010  .  Smokeless tobacco: Never Used  . Alcohol use Yes     Comment: about 3 drinks a week  . Drug use: No  . Sexual activity: Not on file     Comment: ocassional smoker   Other Topics Concern  . Not on file   Social History Narrative  . No narrative on file    Allergies  Allergen Reactions  . Prednisone     Review of Systems  Constitutional: Negative.   HENT: Negative.   Eyes: Negative.   Respiratory: Negative.   Cardiovascular: Negative.   Gastrointestinal: Negative.   Genitourinary: Negative.   Musculoskeletal: Negative.   Skin: Negative.   Neurological: Negative.   Endo/Heme/Allergies: Negative.   Psychiatric/Behavioral: Negative.     Immunization History  Administered Date(s) Administered  . DTaP 02/18/1988, 04/19/1988, 06/19/1988, 06/15/1989, 12/26/1991  . Hepatitis A 10/05/2005, 07/20/2006  . Hepatitis B 03/14/1999, 04/25/1999, 09/26/1999  . HiB (PRP-OMP) 03/21/1989, 01/13/1993  . IPV 02/17/1988, 04/19/1988, 06/19/1988, 06/15/1989  . MMR 03/21/1989, 01/13/1993  . Meningococcal Conjugate 10/05/2005  . Td 10/23/2002  . Tdap 12/10/2008   Objective:  There were no vitals taken for this visit.  Physical Exam  Constitutional: She is oriented to person, place, and time and well-developed, well-nourished, and in no distress.  Eyes: Conjunctivae and EOM are normal. Pupils are  equal, round, and reactive to light.  Neck: Normal range of motion. Neck supple.  Cardiovascular: Normal rate, regular rhythm, normal heart sounds and intact distal pulses.   Pulmonary/Chest: Effort normal and breath sounds normal.  Musculoskeletal: Normal range of motion.  Neurological: She is alert and oriented to person, place, and time. She has normal reflexes. Gait normal. GCS score is 15.  Skin: Skin is warm and dry.  Psychiatric: Mood, memory, affect and judgment normal.    Lab Results  Component Value Date   WBC 4.6 05/20/2014   HGB 15.0 05/20/2014   HCT 41 05/20/2014   PLT 248  05/20/2014   CHOL 193 05/20/2014   TRIG 119 05/20/2014   HDL 86 (A) 05/20/2014   LDLCALC 83 05/20/2014   TSH 2.76 05/20/2014    CMP     Component Value Date/Time   NA 139 05/20/2014   K 4.4 05/20/2014   BUN 12 05/20/2014   CREATININE 0.8 05/20/2014   AST 23 05/20/2014   ALT 14 05/20/2014   ALKPHOS 75 05/20/2014    Assessment and Plan :  1. Anxiety  2. Depression 50%-75% improved. Continue same dose of Paxil and follow up in 3-4 months She is pleased with her progress and does not wish to increase the dose. 3. ADD (attention deficit disorder)   HPI, Exam, and A&P Transcribed under the direction and in the presence of Graiden Henes L. Cranford Mon, MD  Electronically Signed: Webb Laws, CMA I have done the exam and reviewed the above chart and it is accurate to the best of my knowledge.  Miguel Aschoff MD [facility] 02/23/2016 2:47 PM

## 2016-03-10 ENCOUNTER — Telehealth: Payer: Self-pay | Admitting: Family Medicine

## 2016-03-10 NOTE — Telephone Encounter (Signed)
Pt states the insurance has denied the Rx diclofenac sodium (VOLTAREN) 1 % GEL due to it is not used to treat shoulder, hip or spine.  Pt is asking if she can get a different Rx.  [facility] [address]  CB#[id number]/MW

## 2016-03-10 NOTE — Telephone Encounter (Signed)
Is this okay? Emily Drozdowski, CMA  

## 2016-03-11 NOTE — Telephone Encounter (Signed)
Rx Naproxen 500mg  BID prn,#60,3rf.

## 2016-03-13 NOTE — Telephone Encounter (Signed)
Insurance rarely covers topical treament as they did not cover topical Voltaren.

## 2016-03-13 NOTE — Telephone Encounter (Signed)
Pt advised. Patient does not want to take oral medications. Voltaren gel was not approved. She wanted something topical. I told patient we can refer her out if she wants and ortho specialist may know more things for the joint aches she gets that is topical. Patient states she will let us know if she needs to go that route-aa

## 2016-04-17 ENCOUNTER — Telehealth: Payer: Self-pay | Admitting: Family Medicine

## 2016-04-17 DIAGNOSIS — F988 Other specified behavioral and emotional disorders with onset usually occurring in childhood and adolescence: Secondary | ICD-10-CM

## 2016-04-17 MED ORDER — AMPHETAMINE-DEXTROAMPHET ER 30 MG PO CP24: 30.0000 mg | ORAL_CAPSULE | Freq: Every day | ORAL | 0 refills | Status: DC

## 2016-04-17 NOTE — Telephone Encounter (Signed)
Please review. KW 

## 2016-04-17 NOTE — Telephone Encounter (Signed)
Pt needs refill on her  amphetamine-dextroamphetamine (ADDERALL XR) 30 MG 24 hr capsule  Thanks Barth Kirkseri

## 2016-04-18 ENCOUNTER — Other Ambulatory Visit: Payer: Self-pay | Admitting: Family Medicine

## 2016-04-18 DIAGNOSIS — F988 Other specified behavioral and emotional disorders with onset usually occurring in childhood and adolescence: Secondary | ICD-10-CM

## 2016-04-18 MED ORDER — AMPHETAMINE-DEXTROAMPHET ER 30 MG PO CP24: 30.0000 mg | ORAL_CAPSULE | Freq: Every day | ORAL | 0 refills | Status: DC

## 2016-05-16 ENCOUNTER — Telehealth: Payer: Self-pay | Admitting: Family Medicine

## 2016-05-16 DIAGNOSIS — F988 Other specified behavioral and emotional disorders with onset usually occurring in childhood and adolescence: Secondary | ICD-10-CM

## 2016-05-16 NOTE — Telephone Encounter (Signed)
Please review, next appt 06/14/16

## 2016-05-16 NOTE — Telephone Encounter (Signed)
Ok--she has Jan appt already

## 2016-05-16 NOTE — Telephone Encounter (Signed)
pt needs refill on   amphetamine-dextroamphetamine (ADDERALL XR) 30 MG 24 hr capsule  Thanks Barth Kirkseri

## 2016-05-17 MED ORDER — AMPHETAMINE-DEXTROAMPHET ER 30 MG PO CP24: 30.0000 mg | ORAL_CAPSULE | Freq: Every day | ORAL | 0 refills | Status: DC

## 2016-05-17 NOTE — Telephone Encounter (Signed)
Printed. Pt informed. 

## 2016-06-14 ENCOUNTER — Ambulatory Visit: Payer: BLUE CROSS/BLUE SHIELD | Admitting: Family Medicine

## 2016-06-16 ENCOUNTER — Other Ambulatory Visit: Payer: Self-pay | Admitting: Family Medicine

## 2016-06-16 DIAGNOSIS — F988 Other specified behavioral and emotional disorders with onset usually occurring in childhood and adolescence: Secondary | ICD-10-CM

## 2016-06-16 MED ORDER — AMPHETAMINE-DEXTROAMPHET ER 30 MG PO CP24: 30.0000 mg | ORAL_CAPSULE | Freq: Every day | ORAL | 0 refills | Status: DC

## 2016-06-16 NOTE — Telephone Encounter (Signed)
Pt needs refill on her   amphetamine-dextroamphetamine (ADDERALL XR) 30 MG 24 hr capsule  She would like someone to write this today.    Her call back is [phone]

## 2016-06-27 ENCOUNTER — Encounter: Payer: Self-pay | Admitting: Family Medicine

## 2016-06-27 ENCOUNTER — Ambulatory Visit (INDEPENDENT_AMBULATORY_CARE_PROVIDER_SITE_OTHER): Payer: BLUE CROSS/BLUE SHIELD | Admitting: Family Medicine

## 2016-06-27 VITALS — BP 122/74 | HR 88 | Temp 98.4°F | Resp 14 | Wt 175.0 lb

## 2016-06-27 DIAGNOSIS — F329 Major depressive disorder, single episode, unspecified: Secondary | ICD-10-CM

## 2016-06-27 DIAGNOSIS — F419 Anxiety disorder, unspecified: Secondary | ICD-10-CM

## 2016-06-27 DIAGNOSIS — F32A Depression, unspecified: Secondary | ICD-10-CM

## 2016-06-27 DIAGNOSIS — G4700 Insomnia, unspecified: Secondary | ICD-10-CM | POA: Diagnosis not present

## 2016-06-27 DIAGNOSIS — F988 Other specified behavioral and emotional disorders with onset usually occurring in childhood and adolescence: Secondary | ICD-10-CM

## 2016-06-27 MED ORDER — AMPHETAMINE-DEXTROAMPHET ER 30 MG PO CP24: 30.0000 mg | ORAL_CAPSULE | Freq: Every day | ORAL | 0 refills | Status: DC

## 2016-06-27 NOTE — Progress Notes (Signed)
Subjective:  HPI Anxiety- Pt reports that she is still about the same (50-75% better) as last OV. She is taking the paxil and feeling well on it. She is still taking the Lorazepam at night.   ADD- Pt is doing well and is taking her adderall and is stable.    Pt reports that she believes we were going to get blood work on her today. She has no documented labs since 2015.   Prior to Admission medications   Medication Sig Start Date End Date Taking? Authorizing Provider  amphetamine-dextroamphetamine (ADDERALL XR) 30 MG 24 hr capsule Take 1 capsule (30 mg total) by mouth daily. 06/16/16  Yes Birdie Sons, MD  diclofenac sodium (VOLTAREN) 1 % GEL Apply 4 g topically 4 (four) times daily. 02/23/16  Yes Earnest Thalman Maceo Pro., MD  LORazepam (ATIVAN) 1 MG tablet Take 1 tablet (1 mg total) by mouth at bedtime. 11/01/15  Yes Sophie Tamez Maceo Pro., MD  MULTIPLE VITAMIN PO Take by mouth. 11/02/11  Yes Historical Provider, MD  PARoxetine (PAXIL) 20 MG tablet Take 1 tablet (20 mg total) by mouth daily. 01/04/16  Yes Laurna Shetley Maceo Pro., MD    Patient Active Problem List   Diagnosis Date Noted  . Acne 10/05/2014  . ADD (attention deficit disorder) 10/05/2014  . Anxiety 10/05/2014  . Cannot sleep 10/05/2014  . Restless leg 10/05/2014    History reviewed. No pertinent past medical history.  Social History   Social History  . Marital status: Single    Spouse name: none  . Number of children: 0  . Years of education: 3   Occupational History  . [employer]    Social History Main Topics  . Smoking status: Former Smoker    Packs/day: 0.25    Types: Cigarettes    Quit date: 05/29/2010  . Smokeless tobacco: Never Used  . Alcohol use Yes     Comment: about 3 drinks a week  . Drug use: No  . Sexual activity: Not on file     Comment: ocassional smoker   Other Topics Concern  . Not on file   Social History Narrative  . No narrative on file    Allergies  Allergen Reactions  .  Prednisone     Review of Systems  Constitutional: Negative.   HENT: Negative.   Eyes: Negative.   Respiratory: Negative.   Cardiovascular: Negative.   Gastrointestinal: Negative.   Genitourinary: Negative.   Musculoskeletal: Negative.   Skin: Negative.   Neurological: Negative.   Endo/Heme/Allergies: Negative.   Psychiatric/Behavioral: The patient is nervous/anxious.     Immunization History  Administered Date(s) Administered  . DTaP 02/18/1988, 04/19/1988, 06/19/1988, 06/15/1989, 12/26/1991  . Hepatitis A 10/05/2005, 07/20/2006  . Hepatitis B 03/14/1999, 04/25/1999, 09/26/1999  . HiB (PRP-OMP) 03/21/1989, 01/13/1993  . IPV 02/17/1988, 04/19/1988, 06/19/1988, 06/15/1989  . MMR 03/21/1989, 01/13/1993  . Meningococcal Conjugate 10/05/2005  . Td 10/23/2002  . Tdap 12/10/2008    Objective:  BP 122/74 (BP Location: Left Arm, Patient Position: Sitting, Cuff Size: Normal)   Pulse 88   Temp 98.4 F (36.9 C) (Oral)   Resp 14   Wt 175 lb (79.4 kg)   BMI 28.68 kg/m   Physical Exam  Constitutional: She is oriented to person, place, and time and well-developed, well-nourished, and in no distress.  HENT:  Head: Normocephalic.  Eyes: Conjunctivae and EOM are normal. Pupils are equal, round, and reactive to light.  Neck: Normal range of  motion. Neck supple.  Cardiovascular: Normal rate, regular rhythm, normal heart sounds and intact distal pulses.   Pulmonary/Chest: Effort normal and breath sounds normal.  Musculoskeletal: Normal range of motion.  Neurological: She is alert and oriented to person, place, and time. She has normal reflexes. Gait normal. GCS score is 15.  Skin: Skin is warm and dry.  Psychiatric: Mood, memory, affect and judgment normal.    Lab Results  Component Value Date   WBC 4.6 05/20/2014   HGB 15.0 05/20/2014   HCT 41 05/20/2014   PLT 248 05/20/2014   CHOL 193 05/20/2014   TRIG 119 05/20/2014   HDL 86 (A) 05/20/2014   LDLCALC 83 05/20/2014   TSH  2.76 05/20/2014    CMP     Component Value Date/Time   NA 139 05/20/2014   K 4.4 05/20/2014   BUN 12 05/20/2014   CREATININE 0.8 05/20/2014   AST 23 05/20/2014   ALT 14 05/20/2014   ALKPHOS 75 05/20/2014    Assessment and Plan :  1. Attention deficit disorder, unspecified hyperactivity presence Refills x 3. Follow up in 4 months.  - amphetamine-dextroamphetamine (ADDERALL XR) 30 MG 24 hr capsule; Take 1 capsule (30 mg total) by mouth daily.  Dispense: 30 capsule; Refill: 0 - Comprehensive metabolic panel  2. Anxiety  - TSH - Comprehensive metabolic panel  3. Depression, unspecified depression type  - Comprehensive metabolic panel  4. Insomnia, unspecified type  - CBC with Differential/Platelet - TSH - Comprehensive metabolic panel   HPI, Exam, and A&P Transcribed under the direction and in the presence of Milanie Rosenfield L. Cranford Mon, MD  Electronically Signed: Katina Dung, CMA I have done the exam and reviewed the above chart and it is accurate to the best of my knowledge. Development worker, community has been used in this note in any air is in the dictation or transcription are unintentional.  [facility] 06/27/2016 8:23 AM

## 2016-07-05 LAB — CBC WITH DIFFERENTIAL/PLATELET
Basophils Absolute: 0 10*3/uL (ref 0.0–0.2)
Basos: 0 %
EOS (ABSOLUTE): 0.1 10*3/uL (ref 0.0–0.4)
Eos: 3 %
Hematocrit: 44.5 % (ref 34.0–46.6)
Hemoglobin: 15.8 g/dL (ref 11.1–15.9)
IMMATURE GRANULOCYTES: 0 %
Immature Grans (Abs): 0 10*3/uL (ref 0.0–0.1)
LYMPHS ABS: 1 10*3/uL (ref 0.7–3.1)
Lymphs: 24 %
MCH: 32.6 pg (ref 26.6–33.0)
MCHC: 35.5 g/dL (ref 31.5–35.7)
MCV: 92 fL (ref 79–97)
MONOS ABS: 0.5 10*3/uL (ref 0.1–0.9)
Monocytes: 12 %
NEUTROS PCT: 61 %
Neutrophils Absolute: 2.7 10*3/uL (ref 1.4–7.0)
PLATELETS: 229 10*3/uL (ref 150–379)
RBC: 4.84 x10E6/uL (ref 3.77–5.28)
RDW: 12.8 % (ref 12.3–15.4)
WBC: 4.4 10*3/uL (ref 3.4–10.8)

## 2016-07-05 LAB — COMPREHENSIVE METABOLIC PANEL
A/G RATIO: 1.9 (ref 1.2–2.2)
ALK PHOS: 97 IU/L (ref 39–117)
ALT: 23 IU/L (ref 0–32)
AST: 23 IU/L (ref 0–40)
Albumin: 4.8 g/dL (ref 3.5–5.5)
BILIRUBIN TOTAL: 0.6 mg/dL (ref 0.0–1.2)
BUN/Creatinine Ratio: 11 (ref 9–23)
BUN: 10 mg/dL (ref 6–20)
CALCIUM: 9.7 mg/dL (ref 8.7–10.2)
CHLORIDE: 99 mmol/L (ref 96–106)
CO2: 22 mmol/L (ref 18–29)
Creatinine, Ser: 0.89 mg/dL (ref 0.57–1.00)
GFR calc Af Amer: 102 mL/min/{1.73_m2} (ref >59)
GFR, EST NON AFRICAN AMERICAN: 88 mL/min/{1.73_m2} (ref >59)
GLOBULIN, TOTAL: 2.5 g/dL (ref 1.5–4.5)
Glucose: 96 mg/dL (ref 65–99)
Potassium: 4.9 mmol/L (ref 3.5–5.2)
Sodium: 140 mmol/L (ref 134–144)
Total Protein: 7.3 g/dL (ref 6.0–8.5)

## 2016-07-05 LAB — TSH: TSH: 2.46 u[IU]/mL (ref 0.450–4.500)

## 2016-10-06 ENCOUNTER — Other Ambulatory Visit: Payer: Self-pay | Admitting: Emergency Medicine

## 2016-10-06 DIAGNOSIS — F988 Other specified behavioral and emotional disorders with onset usually occurring in childhood and adolescence: Secondary | ICD-10-CM

## 2016-10-06 MED ORDER — AMPHETAMINE-DEXTROAMPHET ER 30 MG PO CP24: 30.0000 mg | ORAL_CAPSULE | Freq: Every day | ORAL | 0 refills | Status: DC

## 2016-10-25 ENCOUNTER — Ambulatory Visit (INDEPENDENT_AMBULATORY_CARE_PROVIDER_SITE_OTHER): Payer: BLUE CROSS/BLUE SHIELD | Admitting: Family Medicine

## 2016-10-25 DIAGNOSIS — F988 Other specified behavioral and emotional disorders with onset usually occurring in childhood and adolescence: Secondary | ICD-10-CM | POA: Diagnosis not present

## 2016-10-25 DIAGNOSIS — G4709 Other insomnia: Secondary | ICD-10-CM | POA: Diagnosis not present

## 2016-10-25 DIAGNOSIS — F419 Anxiety disorder, unspecified: Secondary | ICD-10-CM | POA: Diagnosis not present

## 2016-10-25 MED ORDER — AMPHETAMINE-DEXTROAMPHET ER 30 MG PO CP24: 30.0000 mg | ORAL_CAPSULE | Freq: Every day | ORAL | 0 refills | Status: DC

## 2016-10-25 MED ORDER — LORAZEPAM 1 MG PO TABS: 1.0000 mg | ORAL_TABLET | Freq: Every day | ORAL | 5 refills | Status: AC

## 2016-10-25 MED ORDER — AMPHETAMINE-DEXTROAMPHET ER 30 MG PO CP24: 30.0000 mg | ORAL_CAPSULE | Freq: Every day | ORAL | 0 refills | Status: AC

## 2016-10-25 NOTE — Progress Notes (Signed)
Michelle Morales  MRN: [id number] DOB: May 31, 1985  Subjective:  HPI   Patient is here for follow up. Patient needs refills on Adderall which she usually takes when she works Monday through Friday and she needs refill on Lorazepam which she takes 1 at bedtime. She does have an appointment with psychiatrist on June 11th with Dr. Maryruth BunKapur. [facility] 10/25/2016 11/01/2015 12/02/2014  Decreased Interest 0 3 0  Down, Depressed, Hopeless 1 2 2   PHQ - 2 Score 1 5 2   Altered sleeping 3 3 3   Tired, decreased energy 1 1 2   Change in appetite 0 0 0  Feeling bad or failure about yourself  0 0 0  Trouble concentrating 0 0 0  Moving slowly or fidgety/restless 0 0 0  Suicidal thoughts 0 0 0  PHQ-9 Score 5 9 7   Difficult doing work/chores - Somewhat difficult Not difficult at all   BP Readings from Last 3 Encounters:  10/25/16 102/80  06/27/16 122/74  02/23/16 110/72   Wt Readings from Last 3 Encounters:  10/25/16 191 lb (86.6 kg)  06/27/16 175 lb (79.4 kg)  02/23/16 172 lb (78 kg)    Patient Active Problem List   Diagnosis Date Noted  . Acne 10/05/2014  . ADD (attention deficit disorder) 10/05/2014  . Anxiety 10/05/2014  . Cannot sleep 10/05/2014  . Restless leg 10/05/2014    No past medical history on file.  Social History   Social History  . Marital status: Single    Spouse name: none  . Number of children: 0  . Years of education: 5617   Occupational History  . [employer]    Social History Main Topics  . Smoking status: Former Smoker    Packs/day: 0.25    Types: Cigarettes    Quit date: 05/29/2010  . Smokeless tobacco: Never Used  . Alcohol use Yes     Comment: about 3 drinks a week  . Drug use: No  . Sexual activity: Not on file     Comment: ocassional smoker   Other Topics Concern  . Not on file   Social History Narrative  . No narrative on file    Outpatient Encounter Prescriptions as of 10/25/2016  Medication Sig Note  .  amphetamine-dextroamphetamine (ADDERALL XR) 30 MG 24 hr capsule Take 1 capsule (30 mg total) by mouth daily.   Marland Kitchen. amphetamine-dextroamphetamine (ADDERALL XR) 30 MG 24 hr capsule Take 1 capsule (30 mg total) by mouth daily.   Marland Kitchen. LORazepam (ATIVAN) 1 MG tablet Take 1 tablet (1 mg total) by mouth at bedtime.   . MULTIPLE VITAMIN PO Take by mouth. 10/05/2014: [facility]  . [DISCONTINUED] amphetamine-dextroamphetamine (ADDERALL XR) 30 MG 24 hr capsule Take 1 capsule (30 mg total) by mouth daily.   . [DISCONTINUED] amphetamine-dextroamphetamine (ADDERALL XR) 30 MG 24 hr capsule Take 1 capsule (30 mg total) by mouth daily.   . [DISCONTINUED] LORazepam (ATIVAN) 1 MG tablet Take 1 tablet (1 mg total) by mouth at bedtime.   Marland Kitchen. PARoxetine (PAXIL) 20 MG tablet Take 1 tablet (20 mg total) by mouth daily. (Patient not taking: Reported on 10/25/2016)    No facility-administered encounter medications on file as of 10/25/2016.     Allergies  Allergen Reactions  . Prednisone     Review of Systems  Constitutional: Positive for malaise/fatigue.  Respiratory: Negative.   Cardiovascular: Negative.   Musculoskeletal: Negative.   Neurological: Negative.   Endo/Heme/Allergies: Negative.  Psychiatric/Behavioral: Positive for depression. The patient has insomnia.     Objective:  BP 102/80   Pulse 76   Temp 98.8 F (37.1 C)   Resp 18   Wt 191 lb (86.6 kg)   BMI 31.30 kg/m   Physical Exam  Constitutional: She is oriented to person, place, and time and well-developed, well-nourished, and in no distress.  HENT:  Head: Normocephalic and atraumatic.  Eyes: Conjunctivae are normal. Pupils are equal, round, and reactive to light.  Neck: Normal range of motion. Neck supple.  Cardiovascular: Normal rate, regular rhythm, normal heart sounds and intact distal pulses.  Exam reveals no gallop.   No murmur heard. Pulmonary/Chest: Effort normal and breath sounds normal. No respiratory  distress. She has no wheezes.  Abdominal: Soft.  Neurological: She is alert and oriented to person, place, and time.  Skin: Skin is warm and dry.  Psychiatric: Mood, memory, affect and judgment normal.   Assessment and Plan :  1. Anxiety Stable. Seen Dr Maryruth Bun on June 11th 2018. Advised patient to take her medication bottles with her to show Dr Maryruth Bun. Patient denies suicidal thoughts or attempts. - LORazepam (ATIVAN) 1 MG tablet; Take 1 tablet (1 mg total) by mouth at bedtime.  Dispense: 30 tablet; Refill: 5  2. Other insomnia Refill given. PHQ 9 score is 5, better. - LORazepam (ATIVAN) 1 MG tablet; Take 1 tablet (1 mg total) by mouth at bedtime.  Dispense: 30 tablet; Refill: 5  3. Attention deficit disorder, unspecified hyperactivity presence Refill given. Stable on the medication.  - amphetamine-dextroamphetamine (ADDERALL XR) 30 MG 24 hr capsule; Take 1 capsule (30 mg total) by mouth daily.  Dispense: 30 capsule; Refill: 0 - amphetamine-dextroamphetamine (ADDERALL XR) 30 MG 24 hr capsule; Take 1 capsule (30 mg total) by mouth daily.  Dispense: 30 capsule; Refill: 0  HPI, Exam and A&P transcribed by Samara Deist, RMA under direction and in the presence of Julieanne Manson, MD.

## 2016-12-11 ENCOUNTER — Telehealth: Payer: Self-pay | Admitting: Family Medicine

## 2016-12-11 NOTE — Telephone Encounter (Signed)
ROI Signed records faxed to the office of Caryn SectionAarti Kapur MD, [place] 11-07-16

## 2017-02-22 ENCOUNTER — Ambulatory Visit: Payer: BLUE CROSS/BLUE SHIELD | Admitting: Family Medicine

## 2017-07-30 ENCOUNTER — Telehealth: Payer: Self-pay

## 2017-07-30 NOTE — Telephone Encounter (Signed)
Called patient to advise her that I checked her benefits for a home sleep test & authorized the procedure. Patient will call back when she's ready to schedule.Titania

## 2017-09-07 ENCOUNTER — Telehealth: Payer: Self-pay

## 2017-09-07 NOTE — Telephone Encounter (Signed)
Spoke with patient in regards to her sleep study authorization status; patient was advised that her authorization was about to expire on 09/24/17; she stated she will call back if she's still interested.Titania  Authorization # [id number] Valid 07/27/17-08/2917

## 2019-06-25 ENCOUNTER — Telehealth: Payer: Self-pay

## 2019-06-25 NOTE — Telephone Encounter (Signed)
Copied from CRM [id number]. Topic: General - Other >> Jun 25, 2019 12:48 PM Marylen Ponto wrote: Reason for CRM: Dr. Soundra Pilon requests call back from Dr. Sullivan Lone as she will be sending patient to the ED. Cb# [phone]

## 2019-06-26 NOTE — Telephone Encounter (Signed)
I spoke with Dr Valora Piccolo. Nothing further from [place].

## 2019-06-26 NOTE — Telephone Encounter (Signed)
Please advise 

## 2019-06-30 ENCOUNTER — Emergency Department: Payer: BLUE CROSS/BLUE SHIELD

## 2019-06-30 ENCOUNTER — Encounter: Payer: Self-pay | Admitting: Emergency Medicine

## 2019-06-30 ENCOUNTER — Emergency Department
Admission: EM | Admit: 2019-06-30 | Discharge: 2019-06-30 | Disposition: A | Discharge status: Home / Self Care | Payer: BLUE CROSS/BLUE SHIELD | Attending: Emergency Medicine | Admitting: Emergency Medicine

## 2019-06-30 ENCOUNTER — Other Ambulatory Visit: Payer: Self-pay

## 2019-06-30 DIAGNOSIS — H05112 Granuloma of left orbit: Secondary | ICD-10-CM | POA: Insufficient documentation

## 2019-06-30 DIAGNOSIS — Z87891 Personal history of nicotine dependence: Secondary | ICD-10-CM | POA: Diagnosis not present

## 2019-06-30 DIAGNOSIS — M7989 Other specified soft tissue disorders: Secondary | ICD-10-CM | POA: Diagnosis present

## 2019-06-30 DIAGNOSIS — Z79899 Other long term (current) drug therapy: Secondary | ICD-10-CM | POA: Diagnosis not present

## 2019-06-30 LAB — CBC WITH DIFFERENTIAL/PLATELET
Abs Immature Granulocytes: 0.01 10*3/uL (ref 0.00–0.07)
Basophils Absolute: 0 10*3/uL (ref 0.0–0.1)
Basophils Relative: 0 %
Eosinophils Absolute: 0.1 10*3/uL (ref 0.0–0.5)
Eosinophils Relative: 2 %
HCT: 44 % (ref 36.0–46.0)
Hemoglobin: 15 g/dL (ref 12.0–15.0)
Immature Granulocytes: 0 %
Lymphocytes Relative: 15 %
Lymphs Abs: 0.7 10*3/uL (ref 0.7–4.0)
MCH: 31.7 pg (ref 26.0–34.0)
MCHC: 34.1 g/dL (ref 30.0–36.0)
MCV: 93 fL (ref 80.0–100.0)
Monocytes Absolute: 0.5 10*3/uL (ref 0.1–1.0)
Monocytes Relative: 10 %
Neutro Abs: 3.6 10*3/uL (ref 1.7–7.7)
Neutrophils Relative %: 73 %
Platelets: 258 10*3/uL (ref 150–400)
RBC: 4.73 MIL/uL (ref 3.87–5.11)
RDW: 11.7 % (ref 11.5–15.5)
WBC: 5 10*3/uL (ref 4.0–10.5)
nRBC: 0 % (ref 0.0–0.2)

## 2019-06-30 LAB — COMPREHENSIVE METABOLIC PANEL
ALT: 31 U/L (ref 0–44)
AST: 34 U/L (ref 15–41)
Albumin: 4.1 g/dL (ref 3.5–5.0)
Alkaline Phosphatase: 96 U/L (ref 38–126)
Anion gap: 11 (ref 5–15)
BUN: 10 mg/dL (ref 6–20)
CO2: 26 mmol/L (ref 22–32)
Calcium: 9.3 mg/dL (ref 8.9–10.3)
Chloride: 99 mmol/L (ref 98–111)
Creatinine, Ser: 0.77 mg/dL (ref 0.44–1.00)
GFR calc Af Amer: >60 mL/min (ref >60)
GFR calc non Af Amer: >60 mL/min (ref >60)
Glucose, Bld: 92 mg/dL (ref 70–99)
Potassium: 3.9 mmol/L (ref 3.5–5.1)
Sodium: 136 mmol/L (ref 135–145)
Total Bilirubin: 0.9 mg/dL (ref 0.3–1.2)
Total Protein: 7.7 g/dL (ref 6.5–8.1)

## 2019-06-30 LAB — SEDIMENTATION RATE: Sed Rate: 7 mm/hr (ref 0–20)

## 2019-06-30 LAB — C-REACTIVE PROTEIN: CRP: 0.5 mg/dL (ref <1.0)

## 2019-06-30 MED ORDER — GADOBUTROL 1 MMOL/ML IV SOLN
8.0000 mL | Freq: Once | INTRAVENOUS | Status: DC | PRN
  Filled 2019-06-30: qty 8

## 2019-06-30 MED ORDER — GADOBUTROL 1 MMOL/ML IV SOLN
7.5000 mL | Freq: Once | INTRAVENOUS | Status: AC | PRN
  Administered 2019-06-30: 12:00:00 7.5 mL via INTRAVENOUS
  Filled 2019-06-30: qty 7.5

## 2019-06-30 NOTE — ED Provider Notes (Signed)
[facility] Emergency Department Provider Note ____________________________________________  Time seen: Approximately 11:06 AM  I have reviewed the triage vital signs and the nursing notes.   HISTORY  Chief Complaint Facial Swelling   HPI Michelle Morales is a 34 y.o. female who presents to the emergency department for treatment and evaluation of left periorbital swelling. Symptoms started around Christmas. She was evaluated by an optometrist who prescribed prednisone drops. No improvement. She was evaluated again today by a different optometrist who sent her to the ER with recommendations for testing and further evaluation.   History reviewed. No pertinent past medical history.  Patient Active Problem List   Diagnosis Date Noted  . Acne 10/05/2014  . ADD (attention deficit disorder) 10/05/2014  . Anxiety 10/05/2014  . Cannot sleep 10/05/2014  . Restless leg 10/05/2014    History reviewed. No pertinent surgical history.  Prior to Admission medications   Medication Sig Start Date End Date Taking? Authorizing Provider  amphetamine-dextroamphetamine (ADDERALL XR) 30 MG 24 hr capsule Take 1 capsule (30 mg total) by mouth daily. 10/25/16   Jerrol Banana., MD  amphetamine-dextroamphetamine (ADDERALL XR) 30 MG 24 hr capsule Take 1 capsule (30 mg total) by mouth daily. 10/25/16   Jerrol Banana., MD  LORazepam (ATIVAN) 1 MG tablet Take 1 tablet (1 mg total) by mouth at bedtime. 10/25/16   Jerrol Banana., MD  MULTIPLE VITAMIN PO Take by mouth. 11/02/11   [provider]  PARoxetine (PAXIL) 20 MG tablet Take 1 tablet (20 mg total) by mouth daily. Patient not taking: Reported on 10/25/2016 01/04/16   Jerrol Banana., MD    Allergies Prednisone  Family History  Problem Relation Age of Onset  . Hypertension Father   . Breast cancer Maternal Grandmother   . Breast cancer Paternal Grandmother     Social History Social History    Tobacco Use  . Smoking status: Former Smoker    Packs/day: 0.25    Types: Cigarettes    Quit date: 05/29/2010    Years since quitting: 9.0  . Smokeless tobacco: Never Used  Substance Use Topics  . Alcohol use: Yes    Comment: about 3 drinks a week  . Drug use: No    Review of Systems   Constitutional: No fever/chills Eyes: Negative for visual changes. Positive for pain. Positive for drainage. Musculoskeletal: Negative for pain. Skin: Negative for rash. Neurological: Negative for headaches, focal weakness or numbness. Allergic: Negative for seasonal allergies. ____________________________________________  PHYSICAL EXAM:  VITAL SIGNS: ED Triage Vitals  Enc Vitals Group     BP 06/30/19 1036 (!) 145/94     Pulse Rate 06/30/19 1036 94     Resp 06/30/19 1036 16     Temp 06/30/19 1036 98.6 F (37 C)     Temp Source 06/30/19 1036 Oral     SpO2 06/30/19 1036 100 %     Weight 06/30/19 1034 190 lb 14.7 oz (86.6 kg)     Height --      Head Circumference --      Peak Flow --      Pain Score 06/30/19 1034 0     Pain Loc --      Pain Edu? --      Excl. in [place]? --     Constitutional: Alert and oriented. Well appearing and in no acute distress. Eyes: Visual acuity--see nursing documentation; no globe trauma; ; Sclera appears anicteric.  Eyelids not inverted.  Conjunctiva appears clear; Cornea without abrasion. Pain with lateral eye movement. Pupils equal, round, and reactive.  Head: Atraumatic. Nose: No congestion/rhinnorhea. Mouth/Throat: Mucous membranes are moist.  Oropharynx non-erythematous. Respiratory: Respirations even and unlabored. Breath sounds clear to auscultation. Musculoskeletal:Normal ROM x 4 extremities. Neurologic:  Normal speech and language. No gross focal neurologic deficits are appreciated. Speech is normal. No gait instability. Skin:  Skin is warm, dry and intact. No rash noted. Psychiatric: Mood and affect are normal. Speech and behavior are  normal.  ____________________________________________   LABS (all labs ordered are listed, but only abnormal results are displayed)  Labs Reviewed  CBC WITH DIFFERENTIAL/PLATELET  COMPREHENSIVE METABOLIC PANEL  SEDIMENTATION RATE  C-REACTIVE PROTEIN  ANA W/REFLEX  THYROID PANEL WITH TSH  LYSOZYME, SERUM  ANGIOTENSIN CONVERTING ENZYME  RPR  QUANTIFERON-TB GOLD PLUS   ____________________________________________  EKG  Not indicated ____________________________________________  RADIOLOGY  MR orbits with and without contrast shows idiopathic orbital inflammation, most likely myositic pseudotumor per radiology. ____________________________________________   PROCEDURES  Procedure(s) performed: None ____________________________________________   INITIAL IMPRESSION / ASSESSMENT AND PLAN / ED COURSE  34 year old female presents to the emergency department for imaging of the orbits due to periorbital edema and eye pain that has not improved with steroid drops.  See HPI for details.   After speaking with the radiologist to determine most appropriate study, MR of the orbits with and without contrast ordered.   Differential diagnosis includes, but not limited to: periorbital cellulitis, orbital cellulitis, pseudotumor  MR showing marked enlargement and irregular enhancement of the lateral rectus muscle on the left side.  There is also some edema and inflammation of the surrounding fat.  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  Dr. Marcello Moores, ophthalmology on call was consulted.  He would like to see her in the office tomorrow.  Labs to be obtained today which will aid in further diagnosing and classifying patient's condition.  He also requested that she have a chest x-ray to rule out sarcoidosis or tuberculosis.  He did not recommend starting her on steroids today.  That may be the planned for tomorrow depending on lab studies.   No medications prescribed.  The plan was reviewed with the patient who agrees to call this afternoon to schedule an appointment for tomorrow.  She was advised to alert the receptionist staff that Dr. Marcello Moores would like to see her tomorrow.  Patient denied any questions.  If she has any symptoms that change or worsen between now and her appointment tomorrow, she is to return to the emergency department.  Pertinent labs & imaging results that were available during my care of the patient were reviewed by me and considered in my medical decision making (see chart for details). ____________________________________________   FINAL CLINICAL IMPRESSION(S) / ED DIAGNOSES  Final diagnoses:  Pseudotumor, orbit inflammatory, left    Note:  This document was prepared using Dragon voice recognition software and may include unintentional dictation errors.    Chinita Pester, FNP 06/30/19 1629    Emily Filbert, MD 07/01/19 [id number]

## 2019-06-30 NOTE — ED Notes (Signed)
Py to MRI

## 2019-06-30 NOTE — Discharge Instructions (Addendum)
See attached information.   It is very important that you follow up with Dr. Lara Mulch tomorrow. Please call today to request an appointment.  Return to the ER for symptoms that change or worsen between now and your appointment tomorrow.

## 2019-06-30 NOTE — ED Notes (Signed)
Presents to ED with some swelling and pressure behind left eye  States she was seen in December by eye MD  And states seen again today  Denies any pain at present  But was sent in for testing by her eye MD

## 2019-06-30 NOTE — ED Triage Notes (Signed)
C/O swelling to left eye x 1 month. Seen by [place] [facility] initially and treated with prednisone eye gtts, which initially helped with the swelling, but symptoms did not resolve.  Seen by Ophthalmology again last Wednesday and had eye exam, which was normal.

## 2019-07-01 LAB — ANGIOTENSIN CONVERTING ENZYME: Angiotensin-Converting Enzyme: 63 U/L (ref 14–82)

## 2019-07-01 LAB — RPR: RPR Ser Ql: NONREACTIVE

## 2019-07-01 LAB — ANA W/REFLEX: Anti Nuclear Antibody (ANA): NEGATIVE

## 2019-07-01 LAB — THYROID PANEL WITH TSH
Free Thyroxine Index: 1.6 (ref 1.2–4.9)
T3 Uptake Ratio: 29 % (ref 24–39)
T4, Total: 5.6 ug/dL (ref 4.5–12.0)
TSH: 1.4 u[IU]/mL (ref 0.450–4.500)

## 2019-07-02 LAB — QUANTIFERON-TB GOLD PLUS (RQFGPL)
QuantiFERON Mitogen Value: >10 IU/mL
QuantiFERON Nil Value: 0.04 IU/mL
QuantiFERON TB1 Ag Value: 0.04 IU/mL
QuantiFERON TB2 Ag Value: 0.03 IU/mL

## 2019-07-02 LAB — QUANTIFERON-TB GOLD PLUS: QuantiFERON-TB Gold Plus: NEGATIVE

## 2019-07-02 LAB — LYSOZYME, SERUM: Lysozyme: 3.9 ug/mL (ref 2.5–12.9)

## 2020-09-22 IMAGING — MR MR ORBITS WO/W CM
4 of 7 series · 25 of 48 positions shown · IV contrast (7.5ml Gadavist)
Comparison: None.

CLINICAL DATA: Swelling of the left eye over the last month. Left
periorbital edema. Discomfort. Duration of symptoms 5-6 weeks.

EXAM:
MRI OF THE ORBITS WITHOUT AND WITH CONTRAST
TECHNIQUE: Multiplanar, multisequence MR imaging of the orbits was performed
both before and after the administration of intravenous contrast.
CONTRAST:  8 cc Gadavist

[Series 5: T1 · sagittal · 3.0mm · 0.38mm/px · 9 of 35 slices shown (1 of 2)]
[im 1/35]
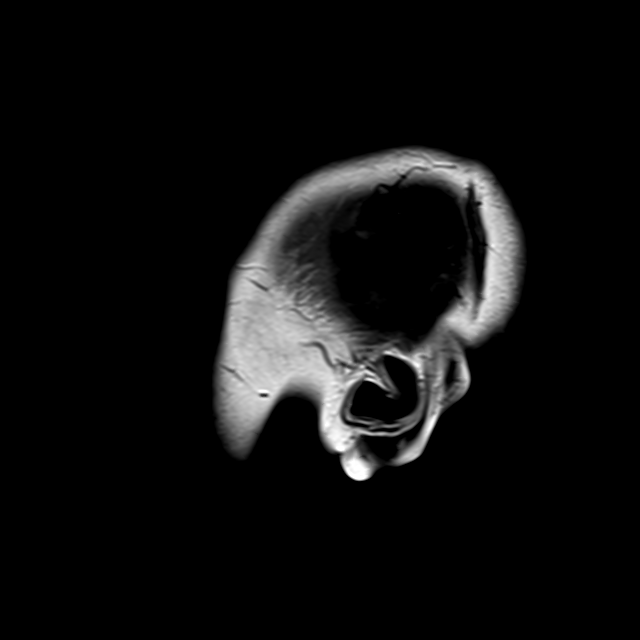
[im 5/35]
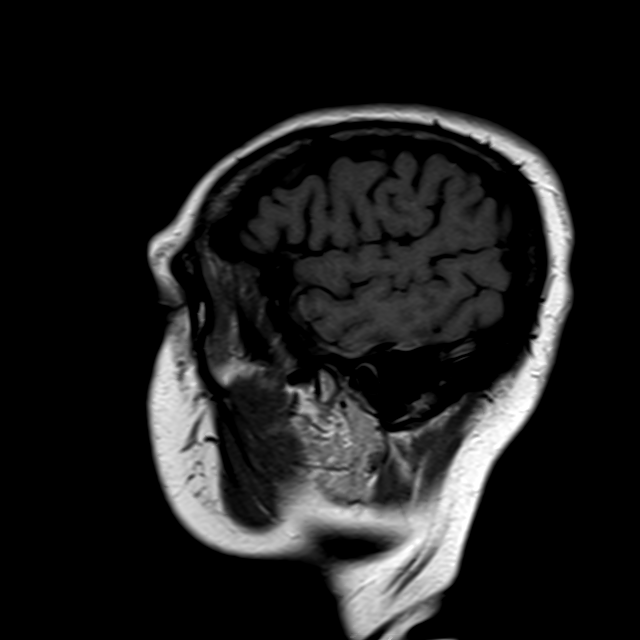
[im 9/35]
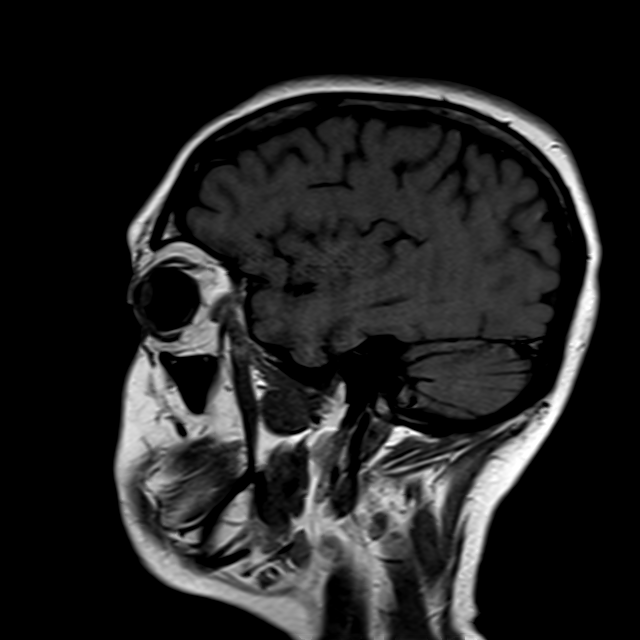
[im 13/35]
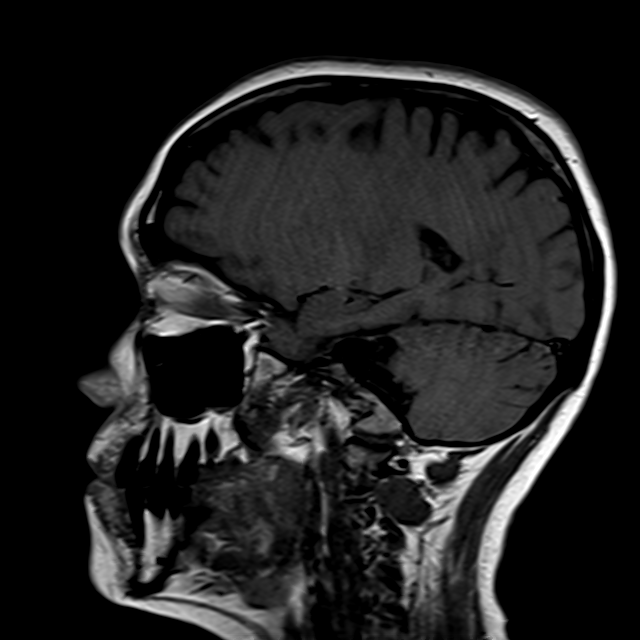
[im 18/35]
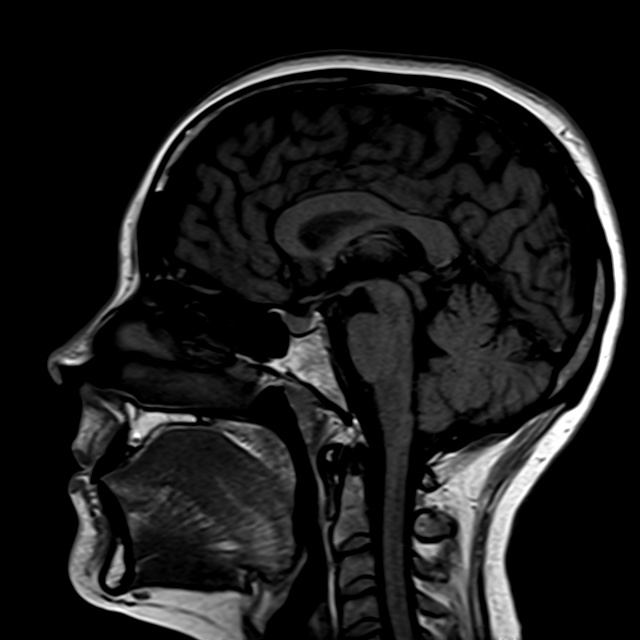
[im 22/35]
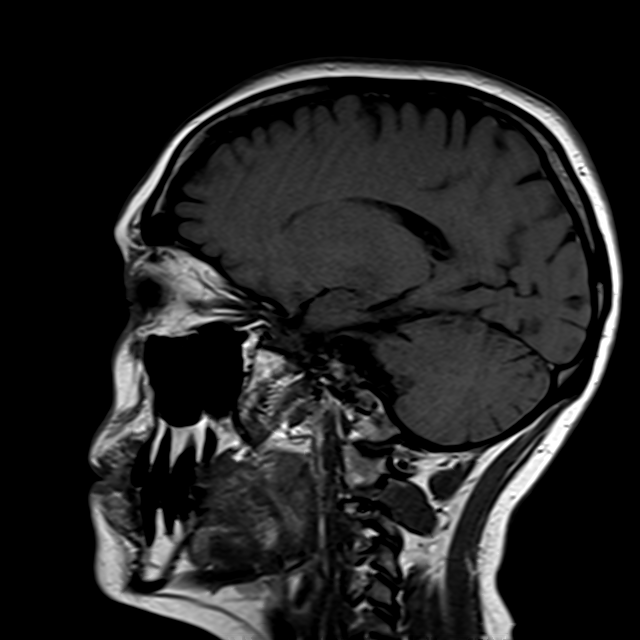
[im 26/35]
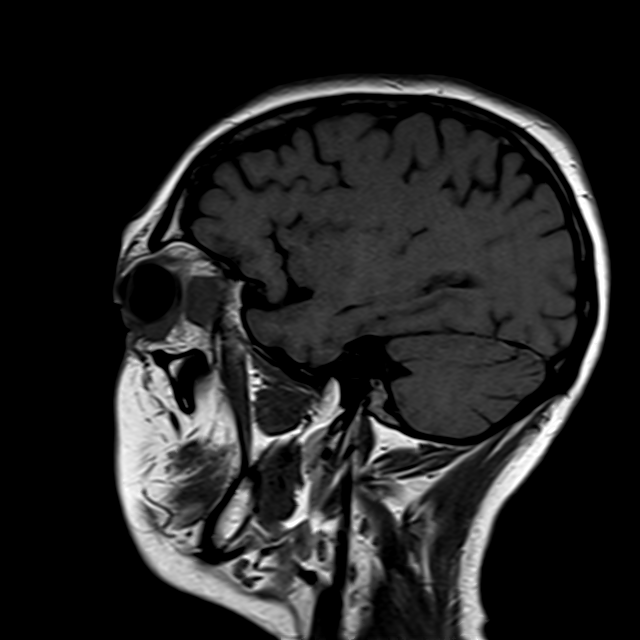
[im 30/35]
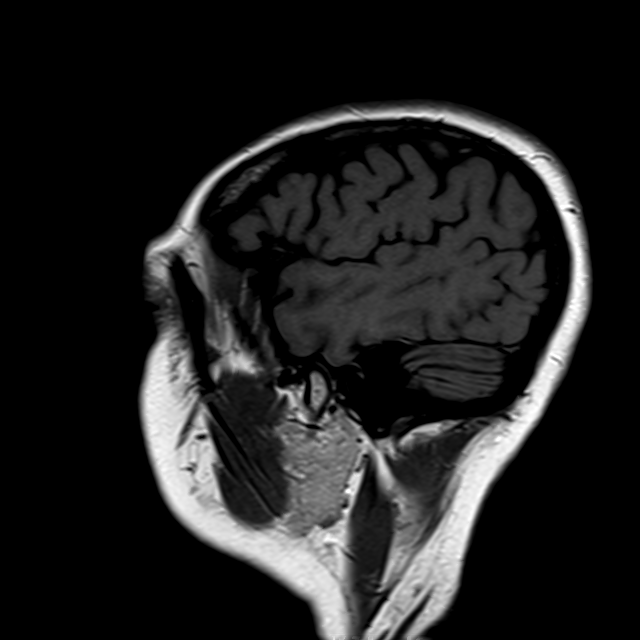
[im 35/35]
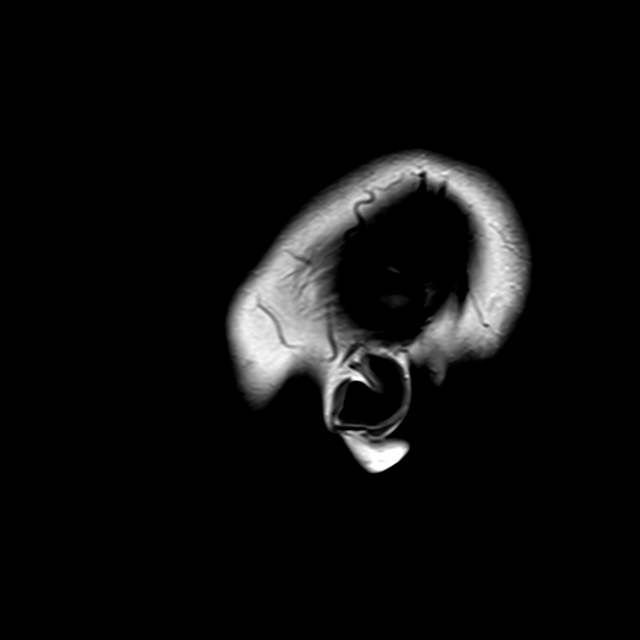

[Series 7: T2 · axial · 3.0mm · 0.78mm/px · z∈[-63,+0]mm · 4 of 17 slices shown (1 of 2)]
[im 1/17]
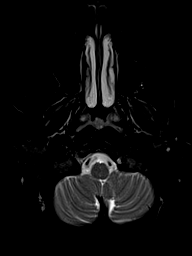
[im 6/17]
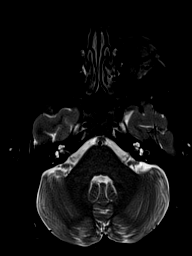
[im 11/17]
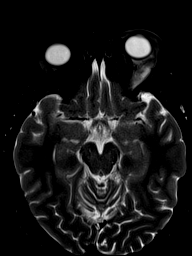
[im 17/17]
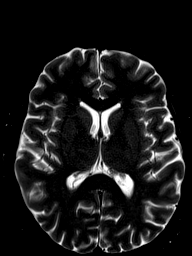

[Series 9: T2 · coronal · 3.0mm · 0.78mm/px · 9 of 35 slices shown (2 of 2)]
[im 1/35]
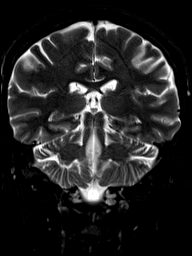
[im 5/35]
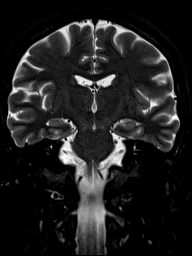
[im 9/35]
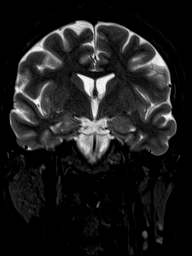
[im 13/35]
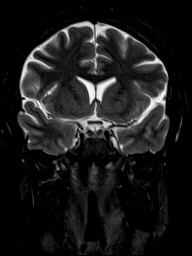
[im 18/35]
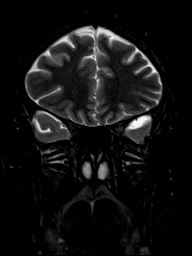
[im 22/35]
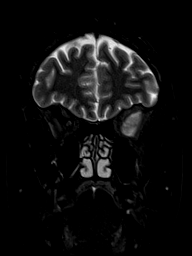
[im 26/35]
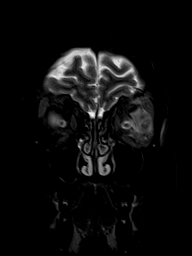
[im 30/35]
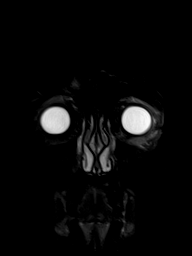
[im 35/35]
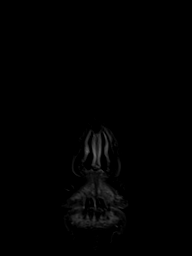

[Series 10: T1 · axial · non-contrast · 3.0mm · 0.31mm/px · z∈[-58,-10]mm · 3 of 17 slices shown (2 of 2)]
[im 1/17]
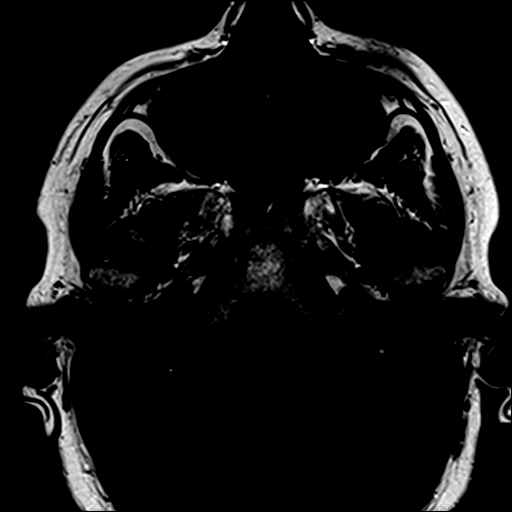
[im 11/17]
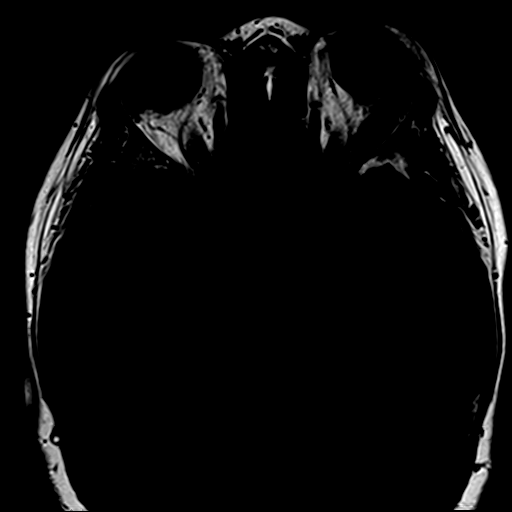
[im 17/17]
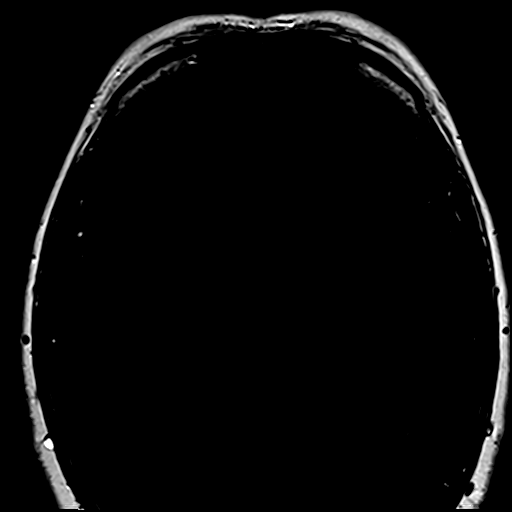

[25 of 48 positions shown; findings below may reference images not displayed]

FINDINGS: Right orbit is normal. On the left, there is pronounced enlargement
of the lateral rectus muscle including the proximal and distal
tendon portions. Diameter of the muscle measures up to 16 x 20 mm,
compared to the normal right muscle measuring 11 x 4 mm. There is
pronounced irregular contrast enhancement. The adjacent orbital fat
shows edematous/inflammatory change. Probable involvement also the
lacrimal gland. Given these findings, the most likely diagnosis is
idiopathic orbital inflammation (orbital pseudotumor) with
classification of myositic pseudotumor.

The differential diagnosis does include thyroid orbitopathy, but
that is felt less likely. Orbital sarcoid and orbital lymphoma can
have this appearance.
IMPRESSION: Marked enlargement and irregular enhancement of the lateral rectus
muscle on the left. Some edema and inflammation of the surrounding
fat. Probable involvement of the left lacrimal gland. Most likely
diagnosis is idiopathic orbital inflammation (orbital pseudotumor).
See above for full discussion.
